# Patient Record
Sex: Male | Born: 1955 | Race: White | Hispanic: No | Marital: Married | Smoking: Current every day smoker
Health system: Southern US, Community
[De-identification: ages and names within clinical notes are randomized; demographics above are authoritative.]

## PROBLEM LIST (undated history)

## (undated) DIAGNOSIS — M545 Low back pain, unspecified: Secondary | ICD-10-CM

## (undated) DIAGNOSIS — F32A Depression, unspecified: Secondary | ICD-10-CM

## (undated) DIAGNOSIS — R7989 Other specified abnormal findings of blood chemistry: Secondary | ICD-10-CM

## (undated) DIAGNOSIS — N529 Male erectile dysfunction, unspecified: Secondary | ICD-10-CM

## (undated) DIAGNOSIS — Z8619 Personal history of other infectious and parasitic diseases: Secondary | ICD-10-CM

## (undated) DIAGNOSIS — G8929 Other chronic pain: Secondary | ICD-10-CM

## (undated) DIAGNOSIS — G4700 Insomnia, unspecified: Secondary | ICD-10-CM

## (undated) DIAGNOSIS — F329 Major depressive disorder, single episode, unspecified: Secondary | ICD-10-CM

## (undated) DIAGNOSIS — I10 Essential (primary) hypertension: Secondary | ICD-10-CM

## (undated) DIAGNOSIS — F419 Anxiety disorder, unspecified: Secondary | ICD-10-CM

## (undated) HISTORY — PX: HERNIA REPAIR: SHX51

## (undated) HISTORY — PX: FINGER SURGERY: SHX640

## (undated) HISTORY — DX: Porphyria cutanea tarda: E80.1

## (undated) HISTORY — DX: Other specified abnormal findings of blood chemistry: R79.89

## (undated) HISTORY — PX: MULTIPLE TOOTH EXTRACTIONS: SHX2053

---

## 2004-02-24 ENCOUNTER — Ambulatory Visit: Payer: Self-pay | Admitting: Internal Medicine

## 2004-06-25 ENCOUNTER — Ambulatory Visit: Payer: Self-pay | Admitting: Internal Medicine

## 2004-06-26 ENCOUNTER — Ambulatory Visit: Payer: Self-pay | Admitting: Internal Medicine

## 2004-08-06 ENCOUNTER — Ambulatory Visit: Payer: Self-pay | Admitting: Internal Medicine

## 2004-08-24 ENCOUNTER — Ambulatory Visit: Payer: Self-pay | Admitting: Internal Medicine

## 2004-10-01 ENCOUNTER — Ambulatory Visit: Payer: Self-pay | Admitting: Internal Medicine

## 2004-10-24 ENCOUNTER — Ambulatory Visit: Payer: Self-pay | Admitting: Internal Medicine

## 2004-11-27 ENCOUNTER — Ambulatory Visit: Payer: Self-pay | Admitting: Internal Medicine

## 2004-12-24 ENCOUNTER — Ambulatory Visit: Payer: Self-pay | Admitting: Internal Medicine

## 2005-01-24 ENCOUNTER — Ambulatory Visit: Payer: Self-pay | Admitting: Internal Medicine

## 2005-02-23 ENCOUNTER — Ambulatory Visit: Payer: Self-pay | Admitting: Internal Medicine

## 2005-03-28 ENCOUNTER — Ambulatory Visit: Payer: Self-pay | Admitting: Internal Medicine

## 2005-04-25 ENCOUNTER — Ambulatory Visit: Payer: Self-pay | Admitting: Internal Medicine

## 2005-05-26 ENCOUNTER — Ambulatory Visit: Payer: Self-pay | Admitting: Internal Medicine

## 2005-06-26 ENCOUNTER — Ambulatory Visit: Payer: Self-pay | Admitting: Internal Medicine

## 2005-08-22 ENCOUNTER — Ambulatory Visit: Payer: Self-pay | Admitting: Internal Medicine

## 2005-08-24 ENCOUNTER — Ambulatory Visit: Payer: Self-pay | Admitting: Internal Medicine

## 2005-08-27 ENCOUNTER — Emergency Department: Payer: Self-pay | Admitting: Emergency Medicine

## 2005-11-17 ENCOUNTER — Ambulatory Visit: Payer: Self-pay | Admitting: Internal Medicine

## 2005-11-23 ENCOUNTER — Ambulatory Visit: Payer: Self-pay | Admitting: Internal Medicine

## 2006-03-20 ENCOUNTER — Ambulatory Visit: Payer: Self-pay | Admitting: Internal Medicine

## 2006-03-26 ENCOUNTER — Ambulatory Visit: Payer: Self-pay | Admitting: Internal Medicine

## 2006-11-24 ENCOUNTER — Ambulatory Visit: Payer: Self-pay | Admitting: Internal Medicine

## 2006-12-25 ENCOUNTER — Ambulatory Visit: Payer: Self-pay | Admitting: Internal Medicine

## 2007-01-12 ENCOUNTER — Ambulatory Visit: Payer: Self-pay | Admitting: Internal Medicine

## 2007-01-25 ENCOUNTER — Ambulatory Visit: Payer: Self-pay | Admitting: Internal Medicine

## 2007-02-24 ENCOUNTER — Ambulatory Visit: Payer: Self-pay | Admitting: Internal Medicine

## 2011-09-30 ENCOUNTER — Ambulatory Visit: Payer: Self-pay | Admitting: Sports Medicine

## 2012-01-13 ENCOUNTER — Emergency Department: Payer: Self-pay | Admitting: *Deleted

## 2014-05-19 ENCOUNTER — Emergency Department: Payer: Self-pay | Admitting: Internal Medicine

## 2014-05-19 LAB — TROPONIN I: Troponin-I: <0.02 ng/mL

## 2014-05-19 LAB — COMPREHENSIVE METABOLIC PANEL
AST: 30 U/L (ref 15–37)
Albumin: 3.9 g/dL (ref 3.4–5.0)
Alkaline Phosphatase: 61 U/L
Anion Gap: 9 (ref 7–16)
BUN: 6 mg/dL — AB (ref 7–18)
Bilirubin,Total: 0.8 mg/dL (ref 0.2–1.0)
CHLORIDE: 105 mmol/L (ref 98–107)
Calcium, Total: 8.7 mg/dL (ref 8.5–10.1)
Co2: 22 mmol/L (ref 21–32)
Creatinine: 0.77 mg/dL (ref 0.60–1.30)
EGFR (African American): >60
Glucose: 104 mg/dL — ABNORMAL HIGH (ref 65–99)
Osmolality: 270 (ref 275–301)
Potassium: 4.1 mmol/L (ref 3.5–5.1)
SGPT (ALT): 24 U/L
Sodium: 136 mmol/L (ref 136–145)
Total Protein: 7.6 g/dL (ref 6.4–8.2)

## 2014-05-19 LAB — CBC
HCT: 45.1 % (ref 40.0–52.0)
HGB: 15 g/dL (ref 13.0–18.0)
MCH: 36.3 pg — ABNORMAL HIGH (ref 26.0–34.0)
MCHC: 33.3 g/dL (ref 32.0–36.0)
MCV: 109 fL — ABNORMAL HIGH (ref 80–100)
Platelet: 143 10*3/uL — ABNORMAL LOW (ref 150–440)
RBC: 4.15 10*6/uL — AB (ref 4.40–5.90)
RDW: 12.6 % (ref 11.5–14.5)
WBC: 3.7 10*3/uL — ABNORMAL LOW (ref 3.8–10.6)

## 2015-11-26 IMAGING — CT CT CERVICAL SPINE WITHOUT CONTRAST
3 of 5 series · 10 of 33 positions shown, 12 images · non-contrast
Comparison: None.

CLINICAL DATA: Left arm numbness and shoulder pain for 4 weeks

EXAM:
CT HEAD WITHOUT CONTRAST
CT CERVICAL SPINE WITHOUT CONTRAST
TECHNIQUE: Multidetector CT imaging of the head and cervical spine was
performed following the standard protocol without intravenous
contrast. Multiplanar CT image reconstructions of the cervical spine
were also generated.

[Series 8: sag bone · sagittal · 0.21mm/px · 5 of 62 slices shown, 6 images]
[im 21/62  bone]
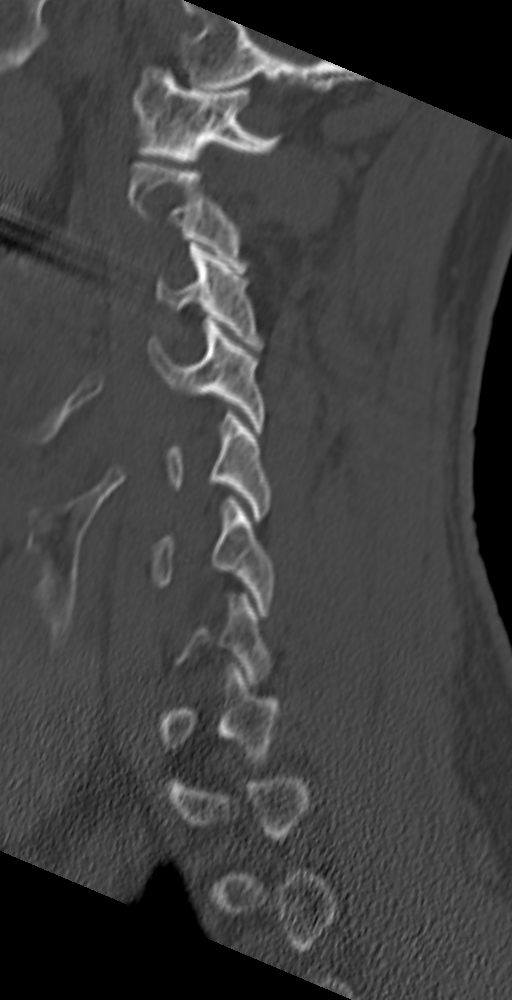
[im 26/62  bone]
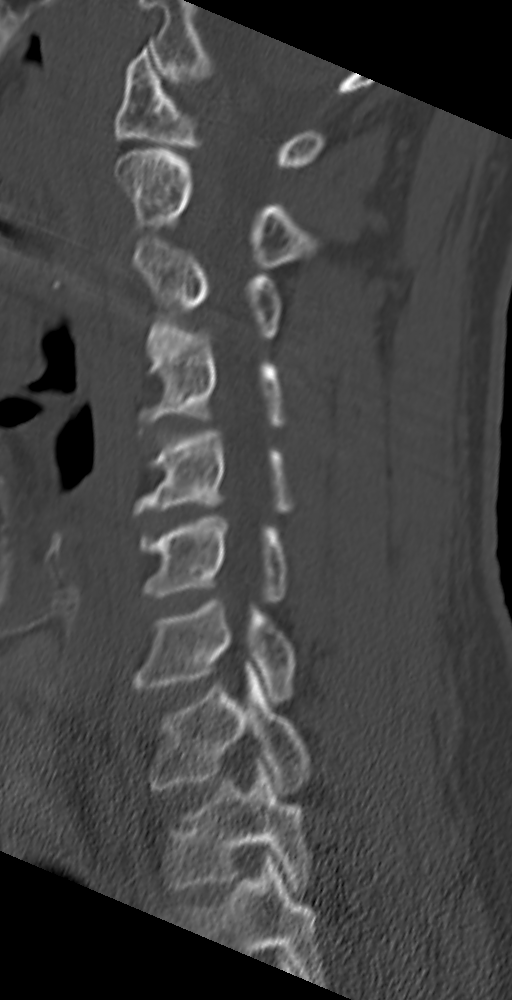
[im 31/62  soft-tissue]
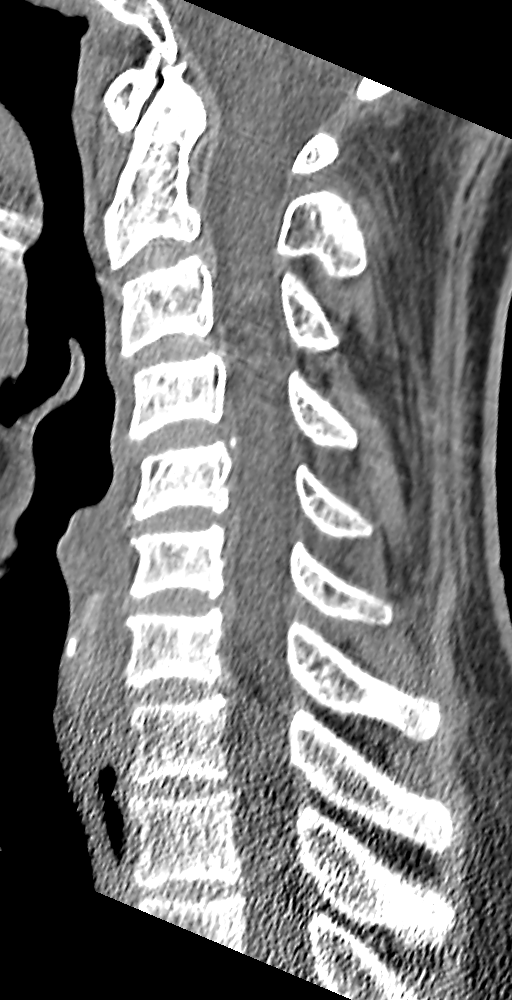
[im 31/62  bone]
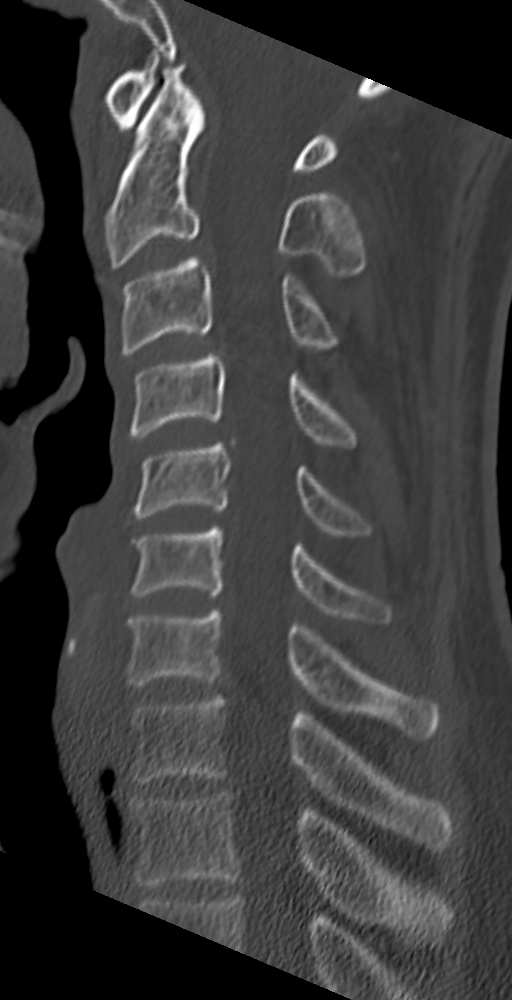
[im 36/62  bone]
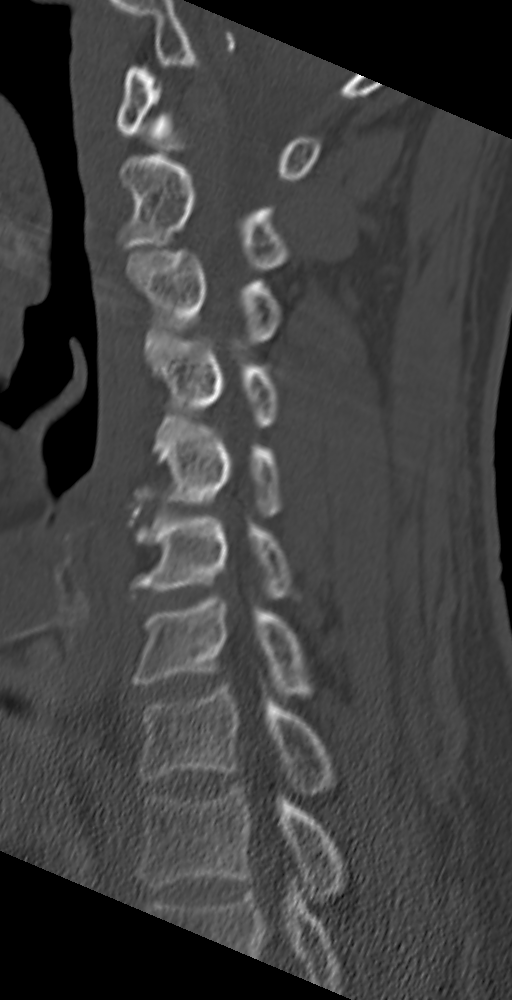
[im 41/62  bone]
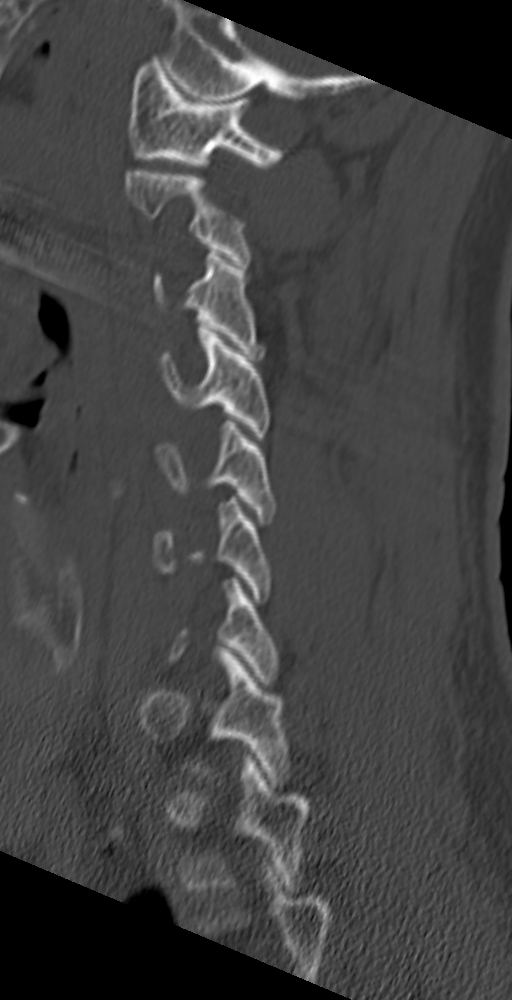

[Series 9: cor bone · coronal · 0.23mm/px · 3 of 48 slices shown]
[im 10/48  bone]
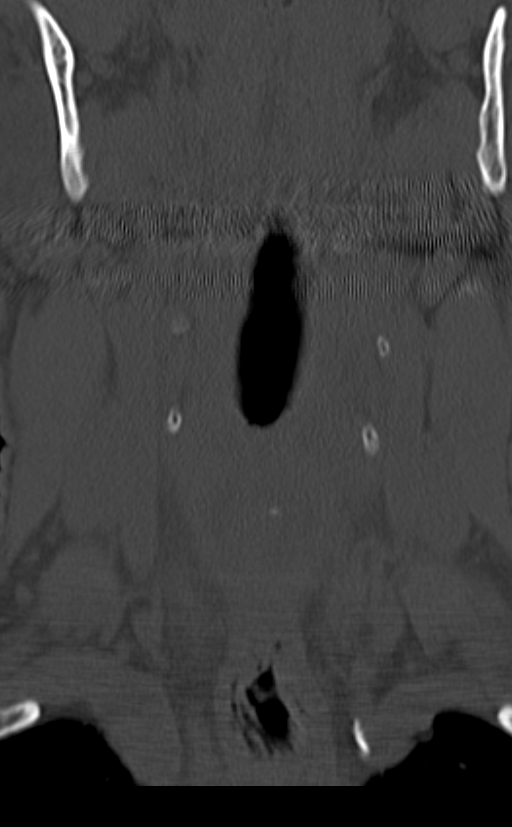
[im 19/48  bone]
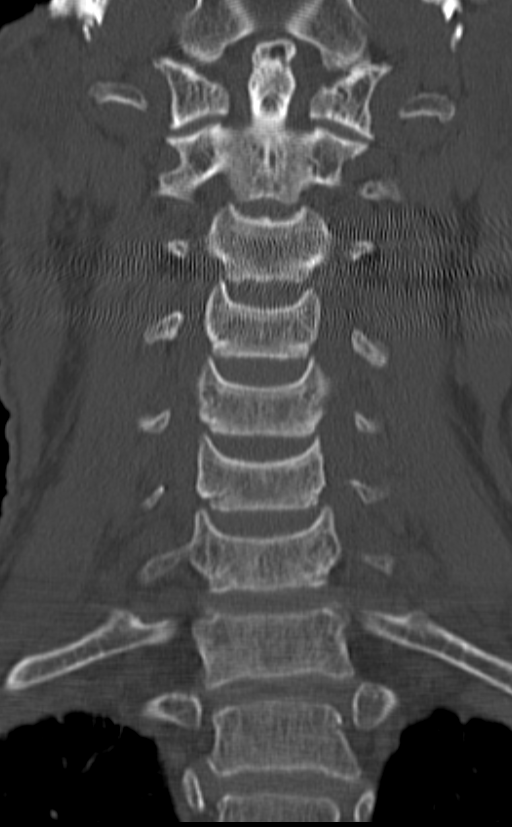
[im 29/48  bone]
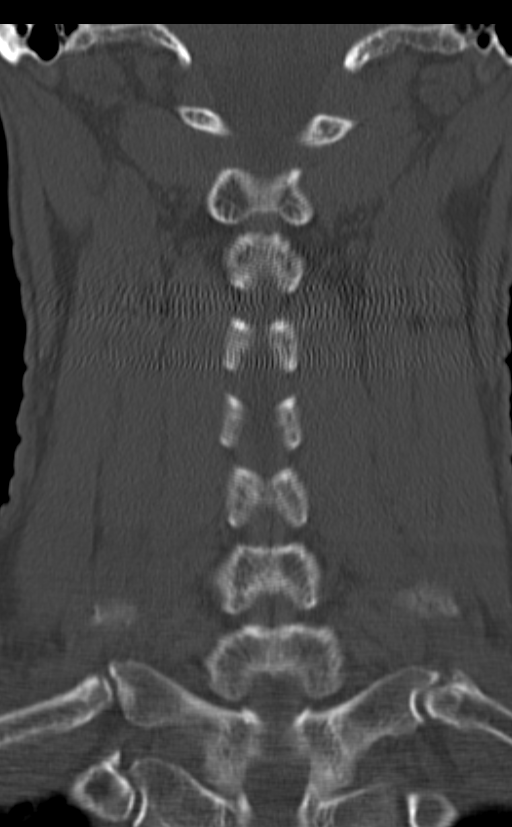

[Series 10: orthogonal axials · axial · 0.19mm/px · z∈[-203,-134]mm · 2 of 95 slices shown, 3 images]
[im 38/95  soft-tissue]
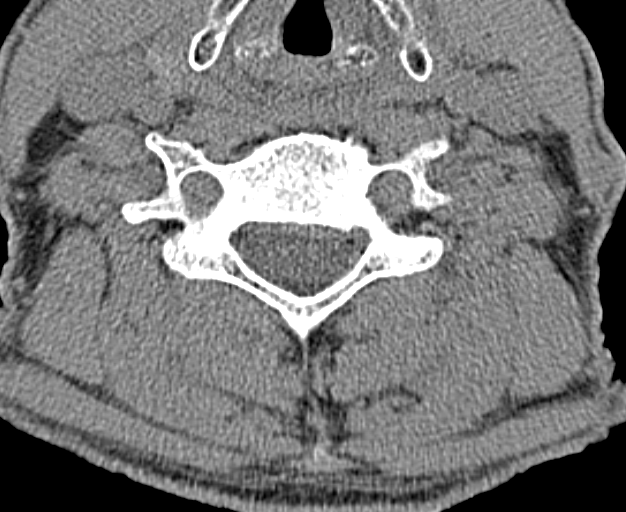
[im 38/95  bone]
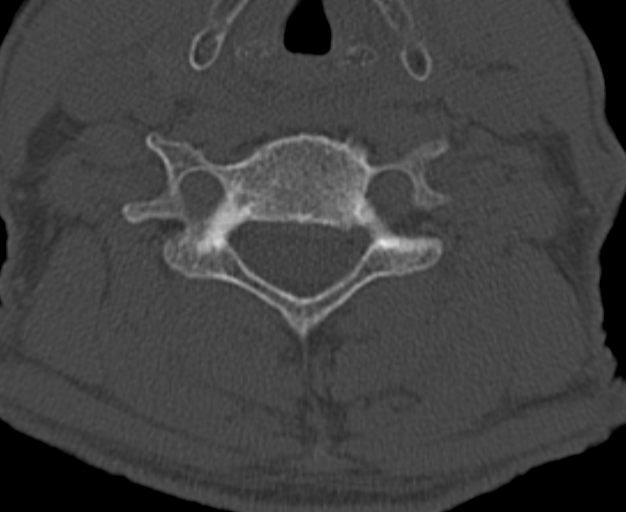
[im 76/95  bone]
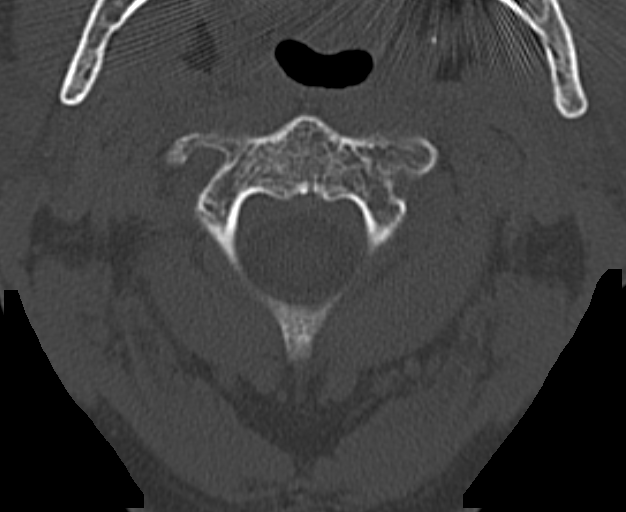

[10 of 33 positions shown; findings below may reference images not displayed]

FINDINGS: CT HEAD FINDINGS

Bony calvarium is intact. Atrophic changes are identified. No
findings to suggest acute hemorrhage, acute infarction or
space-occupying mass lesion are noted.

CT CERVICAL SPINE FINDINGS

Seven cervical segments are well visualized. Vertebral body height
is well maintained. Mild facet hypertrophic changes. No acute
fracture or acute facet abnormality is noted. The surrounding soft
tissues are within normal limits. Lung apices demonstrate mild
emphysematous changes.
IMPRESSION: CT of the head:  Mild atrophic changes without acute abnormality.

CT of the cervical spine: Degenerative change without acute
abnormality.

CT of the cervical spine: Mild degenerative changes without acute
abnormality.

## 2016-03-26 DIAGNOSIS — G8929 Other chronic pain: Secondary | ICD-10-CM | POA: Insufficient documentation

## 2016-08-08 ENCOUNTER — Encounter: Payer: Self-pay | Admitting: *Deleted

## 2016-10-06 ENCOUNTER — Other Ambulatory Visit: Payer: Self-pay | Admitting: Physical Medicine and Rehabilitation

## 2016-10-06 DIAGNOSIS — M5414 Radiculopathy, thoracic region: Secondary | ICD-10-CM

## 2016-10-15 ENCOUNTER — Ambulatory Visit: Payer: Medicare Other

## 2016-10-30 ENCOUNTER — Encounter: Payer: Self-pay | Admitting: *Deleted

## 2016-10-31 ENCOUNTER — Ambulatory Visit
Admission: RE | Admit: 2016-10-31 | Discharge: 2016-10-31 | Disposition: A | Discharge status: Home / Self Care | Payer: Medicare Other | Source: Ambulatory Visit | Attending: Unknown Physician Specialty | Admitting: Unknown Physician Specialty

## 2016-10-31 ENCOUNTER — Ambulatory Visit: Payer: Medicare Other | Admitting: Certified Registered"

## 2016-10-31 ENCOUNTER — Encounter: Payer: Self-pay | Admitting: *Deleted

## 2016-10-31 ENCOUNTER — Encounter
Admission: RE | Disposition: A | Discharge status: Home / Self Care | Payer: Self-pay | Source: Ambulatory Visit | Attending: Unknown Physician Specialty

## 2016-10-31 DIAGNOSIS — M545 Low back pain: Secondary | ICD-10-CM | POA: Insufficient documentation

## 2016-10-31 DIAGNOSIS — F329 Major depressive disorder, single episode, unspecified: Secondary | ICD-10-CM | POA: Diagnosis not present

## 2016-10-31 DIAGNOSIS — F419 Anxiety disorder, unspecified: Secondary | ICD-10-CM | POA: Diagnosis not present

## 2016-10-31 DIAGNOSIS — Z79899 Other long term (current) drug therapy: Secondary | ICD-10-CM | POA: Insufficient documentation

## 2016-10-31 DIAGNOSIS — K5730 Diverticulosis of large intestine without perforation or abscess without bleeding: Secondary | ICD-10-CM | POA: Diagnosis not present

## 2016-10-31 DIAGNOSIS — I10 Essential (primary) hypertension: Secondary | ICD-10-CM | POA: Diagnosis not present

## 2016-10-31 DIAGNOSIS — D124 Benign neoplasm of descending colon: Secondary | ICD-10-CM | POA: Diagnosis not present

## 2016-10-31 DIAGNOSIS — N529 Male erectile dysfunction, unspecified: Secondary | ICD-10-CM | POA: Diagnosis not present

## 2016-10-31 DIAGNOSIS — G4700 Insomnia, unspecified: Secondary | ICD-10-CM | POA: Diagnosis not present

## 2016-10-31 DIAGNOSIS — K6389 Other specified diseases of intestine: Secondary | ICD-10-CM | POA: Diagnosis not present

## 2016-10-31 DIAGNOSIS — Z1211 Encounter for screening for malignant neoplasm of colon: Secondary | ICD-10-CM | POA: Diagnosis not present

## 2016-10-31 DIAGNOSIS — F17210 Nicotine dependence, cigarettes, uncomplicated: Secondary | ICD-10-CM | POA: Diagnosis not present

## 2016-10-31 DIAGNOSIS — K640 First degree hemorrhoids: Secondary | ICD-10-CM | POA: Insufficient documentation

## 2016-10-31 DIAGNOSIS — G8929 Other chronic pain: Secondary | ICD-10-CM | POA: Diagnosis not present

## 2016-10-31 HISTORY — DX: Depression, unspecified: F32.A

## 2016-10-31 HISTORY — PX: COLONOSCOPY WITH PROPOFOL: SHX5780

## 2016-10-31 HISTORY — DX: Low back pain: M54.5

## 2016-10-31 HISTORY — DX: Personal history of other infectious and parasitic diseases: Z86.19

## 2016-10-31 HISTORY — DX: Anxiety disorder, unspecified: F41.9

## 2016-10-31 HISTORY — DX: Male erectile dysfunction, unspecified: N52.9

## 2016-10-31 HISTORY — DX: Other chronic pain: G89.29

## 2016-10-31 HISTORY — DX: Essential (primary) hypertension: I10

## 2016-10-31 HISTORY — DX: Major depressive disorder, single episode, unspecified: F32.9

## 2016-10-31 HISTORY — DX: Insomnia, unspecified: G47.00

## 2016-10-31 HISTORY — DX: Low back pain, unspecified: M54.50

## 2016-10-31 SURGERY — COLONOSCOPY WITH PROPOFOL
Anesthesia: General

## 2016-10-31 MED ORDER — MIDAZOLAM HCL 2 MG/2ML IJ SOLN
INTRAMUSCULAR | Status: AC
  Filled 2016-10-31: qty 2

## 2016-10-31 MED ORDER — SODIUM CHLORIDE 0.9 % IV SOLN
INTRAVENOUS | Status: DC
  Administered 2016-10-31: 1000 mL via INTRAVENOUS

## 2016-10-31 MED ORDER — MIDAZOLAM HCL 5 MG/5ML IJ SOLN
INTRAMUSCULAR | Status: DC | PRN
  Administered 2016-10-31: 2 mg via INTRAVENOUS

## 2016-10-31 MED ORDER — SODIUM CHLORIDE 0.9 % IV SOLN: INTRAVENOUS | Status: DC

## 2016-10-31 MED ORDER — PROPOFOL 500 MG/50ML IV EMUL
INTRAVENOUS | Status: DC | PRN
  Administered 2016-10-31: 120 ug/kg/min via INTRAVENOUS

## 2016-10-31 MED ORDER — LIDOCAINE 2% (20 MG/ML) 5 ML SYRINGE
INTRAMUSCULAR | Status: DC | PRN
  Administered 2016-10-31: 25 mg via INTRAVENOUS

## 2016-10-31 MED ORDER — PROPOFOL 10 MG/ML IV BOLUS
INTRAVENOUS | Status: DC | PRN
  Administered 2016-10-31: 70 mg via INTRAVENOUS
  Administered 2016-10-31: 30 mg via INTRAVENOUS

## 2016-10-31 NOTE — Anesthesia Preprocedure Evaluation (Addendum)
Anesthesia Evaluation  Patient identified by MRN, date of birth, ID band Patient awake    Reviewed: Allergy & Precautions, NPO status , Patient's Chart, lab work & pertinent test results  Airway Mallampati: III       Dental  (+) Partial Upper, Partial Lower   Pulmonary COPD, Current Smoker,     + decreased breath sounds      Cardiovascular Exercise Tolerance: Good hypertension, Pt. on medications and Pt. on home beta blockers  Rhythm:Regular     Neuro/Psych Anxiety Depression    GI/Hepatic negative GI ROS, Neg liver ROS,   Endo/Other  negative endocrine ROS  Renal/GU negative Renal ROS     Musculoskeletal negative musculoskeletal ROS (+)   Abdominal Normal abdominal exam  (+)   Peds  Hematology negative hematology ROS (+)   Anesthesia Other Findings   Reproductive/Obstetrics                            Anesthesia Physical Anesthesia Plan  ASA: III  Anesthesia Plan: General   Post-op Pain Management:    Induction: Intravenous  PONV Risk Score and Plan: 0  Airway Management Planned: Natural Airway  Additional Equipment:   Intra-op Plan:   Post-operative Plan:   Informed Consent: I have reviewed the patients History and Physical, chart, labs and discussed the procedure including the risks, benefits and alternatives for the proposed anesthesia with the patient or authorized representative who has indicated his/her understanding and acceptance.     Plan Discussed with: CRNA  Anesthesia Plan Comments:         Anesthesia Quick Evaluation

## 2016-10-31 NOTE — Anesthesia Postprocedure Evaluation (Signed)
Anesthesia Post Note  Patient: Shawn Gonzalez  Procedure(s) Performed: Procedure(s) (LRB): COLONOSCOPY WITH PROPOFOL (N/A)  Patient location during evaluation: PACU Anesthesia Type: General Level of consciousness: awake Pain management: pain level controlled Vital Signs Assessment: post-procedure vital signs reviewed and stable Respiratory status: spontaneous breathing Cardiovascular status: stable Anesthetic complications: no     Last Vitals:  Vitals:   10/31/16 0949 10/31/16 0959  BP: (!) 146/92 (!) 155/88  Pulse: (!) 56 (!) 52  Resp: 17 (!) 21  Temp:      Last Pain:  Vitals:   10/31/16 0929  TempSrc: Tympanic                 VAN STAVEREN,Markise Haymer

## 2016-10-31 NOTE — H&P (Signed)
   Primary Care Physician:  Idelle Crouch, MD Primary Gastroenterologist:  Dr. Vira Agar  Pre-Procedure History & Physical: HPI:  Shawn Gonzalez is a 61 y.o. male is here for an colonoscopy.   Past Medical History:  Diagnosis Date  . Anxiety   . Chronic low back pain   . Depression   . Erectile dysfunction   . History of chicken pox   . Hypertension   . Insomnia     Past Surgical History:  Procedure Laterality Date  . FINGER SURGERY Right    RT.INDEX  . MULTIPLE TOOTH EXTRACTIONS      Prior to Admission medications   Medication Sig Start Date End Date Taking? Authorizing Provider  amLODipine (NORVASC) 10 MG tablet Take 10 mg by mouth daily.   Yes [provider]  bisoprolol-hydrochlorothiazide (ZIAC) 5-6.25 MG tablet Take 1 tablet by mouth daily.   Yes [provider]  cyclobenzaprine (FLEXERIL) 10 MG tablet Take 10 mg by mouth 3 (three) times daily as needed for muscle spasms.   Yes [provider]  HYDROcodone-acetaminophen (NORCO/VICODIN) 5-325 MG tablet Take 1 tablet by mouth every 6 (six) hours as needed for moderate pain.   Yes [provider]  lisinopril (PRINIVIL,ZESTRIL) 40 MG tablet Take 40 mg by mouth daily.   Yes [provider]  polyethylene glycol-electrolytes (NULYTELY/GOLYTELY) 420 g solution Take 4,000 mLs by mouth once.   Yes [provider]  sildenafil (VIAGRA) 50 MG tablet Take 50 mg by mouth daily as needed for erectile dysfunction.   Yes [provider]    Allergies as of 06/04/2016  . (Not on File)    History reviewed. No pertinent family history.  Social History   Social History  . Marital status: Married    Spouse name: N/A  . Number of children: N/A  . Years of education: N/A   Occupational History  . Not on file.   Social History Main Topics  . Smoking status: Current Every Day Smoker    Packs/day: 1.00    Years: 42.00    Types: Cigarettes  . Smokeless tobacco:  Never Used  . Alcohol use Yes  . Drug use: No  . Sexual activity: Not on file   Other Topics Concern  . Not on file   Social History Narrative  . No narrative on file    Review of Systems: See HPI, otherwise negative ROS  Physical Exam: BP (!) 150/87   Pulse 65   Temp 98.2 F (36.8 C) (Tympanic)   Resp 18   Ht 5\' 8"  (1.727 m)   Wt 79.8 kg (176 lb)   SpO2 100%   BMI 26.76 kg/m  General:   Alert,  pleasant and cooperative in NAD Head:  Normocephalic and atraumatic. Neck:  Supple; no masses or thyromegaly. Lungs:  Clear throughout to auscultation.    Heart:  Regular rate and rhythm. Abdomen:  Soft, nontender and nondistended. Normal bowel sounds, without guarding, and without rebound.   Neurologic:  Alert and  oriented x4;  grossly normal neurologically.  Impression/Plan: Shawn Gonzalez is here for an colonoscopy to be performed for screening colonoscopy  Risks, benefits, limitations, and alternatives regarding  colonoscopy have been reviewed with the patient.  Questions have been answered.  All parties agreeable.   Gaylyn Cheers, MD  10/31/2016, 8:31 AM

## 2016-10-31 NOTE — Transfer of Care (Signed)
Immediate Anesthesia Transfer of Care Note  Patient: Shawn Gonzalez  Procedure(s) Performed: Procedure(s): COLONOSCOPY WITH PROPOFOL (N/A)  Patient Location: Endoscopy Unit  Anesthesia Type:General  Level of Consciousness: awake  Airway & Oxygen Therapy: Patient Spontanous Breathing and Patient connected to nasal cannula oxygen  Post-op Assessment: Report given to RN and Post -op Vital signs reviewed and stable  Post vital signs: Reviewed  Last Vitals:  Vitals:   10/31/16 0821 10/31/16 0929  BP: (!) 150/87 121/79  Pulse: 65   Resp: 18 18  Temp: 36.8 C 36.6 C    Last Pain:  Vitals:   10/31/16 0929  TempSrc: Tympanic         Complications: No apparent anesthesia complications

## 2016-10-31 NOTE — Op Note (Signed)
[facility] Gastroenterology Patient Name: Shawn Gonzalez Procedure Date: 10/31/2016 8:41 AM MRN: [id number] Account #: [id number] Date of Birth: 30-Mar-1956 Admit Type: Outpatient Age: 61 Room: [facility] ENDO ROOM 1 Gender: Male Note Status: Finalized Procedure:            Colonoscopy Indications:          Screening for colorectal malignant neoplasm Providers:            Manya Silvas, MD Referring MD:         Leonie Douglas. Doy Hutching, MD (Referring MD) Medicines:            Propofol per Anesthesia Complications:        No immediate complications. Procedure:            Pre-Anesthesia Assessment:                       - After reviewing the risks and benefits, the patient                        was deemed in satisfactory condition to undergo the                        procedure.                       After obtaining informed consent, the colonoscope was                        passed under direct vision. Throughout the procedure,                        the patient's blood pressure, pulse, and oxygen                        saturations were monitored continuously. The                        Colonoscope was introduced through the anus and                        advanced to the the cecum, identified by appendiceal                        orifice and ileocecal valve. The colonoscopy was                        performed without difficulty. The patient tolerated the                        procedure well. The quality of the bowel preparation                        was good. Findings:      A small polyp was found in the descending colon. The polyp was sessile.       The polyp was removed with a hot snare. Resection and retrieval were       complete.      A few small-mouthed diverticula were found in the sigmoid colon.      A localized area of mildly erythematous mucosa was found in the sigmoid  colon. Biopsies were taken with a cold forceps for histology.      Internal  hemorrhoids were found during endoscopy. The hemorrhoids were       small and Grade I (internal hemorrhoids that do not prolapse). Impression:           - One small polyp in the descending colon, removed with                        a hot snare. Resected and retrieved.                       - Diverticulosis in the sigmoid colon.                       - Erythematous mucosa in the sigmoid colon. Biopsied.                       - Internal hemorrhoids. Recommendation:       - Await pathology results. Manya Silvas, MD 10/31/2016 9:27:18 AM This report has been signed electronically. Number of Addenda: 0 Note Initiated On: 10/31/2016 8:41 AM Scope Withdrawal Time: 0 hours 24 minutes 21 seconds  Total Procedure Duration: 0 hours 32 minutes 43 seconds       [facility]

## 2016-10-31 NOTE — Anesthesia Post-op Follow-up Note (Cosign Needed)
Anesthesia QCDR form completed.        

## 2016-11-03 ENCOUNTER — Encounter: Payer: Self-pay | Admitting: Unknown Physician Specialty

## 2016-11-03 LAB — SURGICAL PATHOLOGY

## 2017-01-05 ENCOUNTER — Ambulatory Visit: Payer: Medicare Other

## 2017-01-05 ENCOUNTER — Ambulatory Visit
Admission: RE | Admit: 2017-01-05 | Discharge: 2017-01-05 | Disposition: A | Discharge status: Home / Self Care | Payer: Medicare Other | Source: Ambulatory Visit | Attending: Physical Medicine and Rehabilitation | Admitting: Physical Medicine and Rehabilitation

## 2017-01-05 DIAGNOSIS — M5134 Other intervertebral disc degeneration, thoracic region: Secondary | ICD-10-CM | POA: Insufficient documentation

## 2017-01-05 DIAGNOSIS — M438X5 Other specified deforming dorsopathies, thoracolumbar region: Secondary | ICD-10-CM | POA: Insufficient documentation

## 2017-01-05 DIAGNOSIS — M5414 Radiculopathy, thoracic region: Secondary | ICD-10-CM | POA: Diagnosis not present

## 2018-12-10 ENCOUNTER — Other Ambulatory Visit: Payer: Self-pay

## 2018-12-10 ENCOUNTER — Inpatient Hospital Stay: Payer: Medicare Other | Attending: Oncology | Admitting: Oncology

## 2018-12-10 ENCOUNTER — Telehealth: Payer: Self-pay | Admitting: *Deleted

## 2018-12-10 ENCOUNTER — Inpatient Hospital Stay: Payer: Medicare Other

## 2018-12-10 ENCOUNTER — Encounter (INDEPENDENT_AMBULATORY_CARE_PROVIDER_SITE_OTHER): Payer: Self-pay

## 2018-12-10 ENCOUNTER — Other Ambulatory Visit: Payer: Self-pay | Admitting: *Deleted

## 2018-12-10 VITALS — BP 137/83 | HR 74 | Resp 18

## 2018-12-10 VITALS — BP 118/76 | HR 65 | Temp 99.4°F | Resp 18 | Wt 164.3 lb

## 2018-12-10 DIAGNOSIS — R7989 Other specified abnormal findings of blood chemistry: Secondary | ICD-10-CM | POA: Diagnosis present

## 2018-12-10 DIAGNOSIS — G4700 Insomnia, unspecified: Secondary | ICD-10-CM | POA: Insufficient documentation

## 2018-12-10 DIAGNOSIS — M545 Low back pain: Secondary | ICD-10-CM | POA: Insufficient documentation

## 2018-12-10 DIAGNOSIS — G8929 Other chronic pain: Secondary | ICD-10-CM | POA: Diagnosis not present

## 2018-12-10 DIAGNOSIS — F419 Anxiety disorder, unspecified: Secondary | ICD-10-CM | POA: Insufficient documentation

## 2018-12-10 DIAGNOSIS — F17210 Nicotine dependence, cigarettes, uncomplicated: Secondary | ICD-10-CM | POA: Insufficient documentation

## 2018-12-10 DIAGNOSIS — I10 Essential (primary) hypertension: Secondary | ICD-10-CM | POA: Diagnosis not present

## 2018-12-10 DIAGNOSIS — E871 Hypo-osmolality and hyponatremia: Secondary | ICD-10-CM

## 2018-12-10 DIAGNOSIS — F329 Major depressive disorder, single episode, unspecified: Secondary | ICD-10-CM | POA: Insufficient documentation

## 2018-12-10 LAB — CBC WITH DIFFERENTIAL/PLATELET
Abs Immature Granulocytes: 0.01 10*3/uL (ref 0.00–0.07)
Basophils Absolute: 0 10*3/uL (ref 0.0–0.1)
Basophils Relative: 1 %
Eosinophils Absolute: 0.1 10*3/uL (ref 0.0–0.5)
Eosinophils Relative: 3 %
HCT: 41.5 % (ref 39.0–52.0)
Hemoglobin: 14.5 g/dL (ref 13.0–17.0)
Immature Granulocytes: 0 %
Lymphocytes Relative: 22 %
Lymphs Abs: 0.9 10*3/uL (ref 0.7–4.0)
MCH: 36.5 pg — ABNORMAL HIGH (ref 26.0–34.0)
MCHC: 34.9 g/dL (ref 30.0–36.0)
MCV: 104.5 fL — ABNORMAL HIGH (ref 80.0–100.0)
Monocytes Absolute: 0.5 10*3/uL (ref 0.1–1.0)
Monocytes Relative: 13 %
Neutro Abs: 2.5 10*3/uL (ref 1.7–7.7)
Neutrophils Relative %: 61 %
Platelets: 132 10*3/uL — ABNORMAL LOW (ref 150–400)
RBC: 3.97 MIL/uL — ABNORMAL LOW (ref 4.22–5.81)
RDW: 12.3 % (ref 11.5–15.5)
WBC: 4.1 10*3/uL (ref 4.0–10.5)
nRBC: 0 % (ref 0.0–0.2)

## 2018-12-10 LAB — IRON AND TIBC
Iron: 138 ug/dL (ref 45–182)
Saturation Ratios: 38 % (ref 17.9–39.5)
TIBC: 360 ug/dL (ref 250–450)
UIBC: 222 ug/dL

## 2018-12-10 LAB — COMPREHENSIVE METABOLIC PANEL
ALT: 49 U/L — ABNORMAL HIGH (ref 0–44)
AST: 40 U/L (ref 15–41)
Albumin: 4.6 g/dL (ref 3.5–5.0)
Alkaline Phosphatase: 56 U/L (ref 38–126)
Anion gap: 10 (ref 5–15)
BUN: 16 mg/dL (ref 8–23)
CO2: 21 mmol/L — ABNORMAL LOW (ref 22–32)
Calcium: 9.7 mg/dL (ref 8.9–10.3)
Chloride: 98 mmol/L (ref 98–111)
Creatinine, Ser: 0.95 mg/dL (ref 0.61–1.24)
GFR calc Af Amer: >60 mL/min (ref >60)
GFR calc non Af Amer: >60 mL/min (ref >60)
Glucose, Bld: 97 mg/dL (ref 70–99)
Potassium: 4.1 mmol/L (ref 3.5–5.1)
Sodium: 129 mmol/L — ABNORMAL LOW (ref 135–145)
Total Bilirubin: 1.6 mg/dL — ABNORMAL HIGH (ref 0.3–1.2)
Total Protein: 8.1 g/dL (ref 6.5–8.1)

## 2018-12-10 LAB — FERRITIN: Ferritin: 772 ng/mL — ABNORMAL HIGH (ref 24–336)

## 2018-12-10 NOTE — Telephone Encounter (Signed)
Called patient's home and left a message on his voicemail stating that he does not need to come back and bring [place] any more urine we had a nurse to do all the tests that we needed to send off for.  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 Janese Banks wants to make sure that he is drinking plenty of liquids if he can keep up with at least drinking 8 glasses of water a day and then other liquids if needed.  We will repeat his level when he comes back next Friday and see what it looks like then.  If he has any questions he can call me back and I left my direct number

## 2018-12-10 NOTE — Progress Notes (Signed)
Patient is here today for follow up, he is doing well no complaints.  

## 2018-12-11 LAB — HIV ANTIBODY (ROUTINE TESTING W REFLEX): HIV Screen 4th Generation wRfx: NONREACTIVE

## 2018-12-11 LAB — HEPATITIS B SURFACE ANTIGEN: Hepatitis B Surface Ag: NEGATIVE

## 2018-12-11 LAB — HEPATITIS B SURFACE ANTIBODY, QUANTITATIVE: Hepatitis B-Post: <3.1 m[IU]/mL — ABNORMAL LOW (ref >9.9)

## 2018-12-11 LAB — HEPATITIS C ANTIBODY: HCV Ab: <0.1 s/co ratio (ref 0.0–0.9)

## 2018-12-13 LAB — MISC LABCORP TEST (SEND OUT): LabCorp test name: 3053

## 2018-12-13 LAB — PORPHOBILINOGEN, RANDOM URINE: Quantitative Porphobilinogen: 0.6 mg/L (ref 0.0–2.0)

## 2018-12-14 LAB — PORPHYRINS, FRACTIONATED, RANDOM URINE
Coproporphyrin (CP) I: 64 ug/L — ABNORMAL HIGH (ref 0–15)
Coproporphyrin (CP) III: 58 ug/L — ABNORMAL HIGH (ref 0–49)
Heptacarboxyl (7-CP): 395 ug/L — ABNORMAL HIGH (ref 0–2)
Hexacarboxyl (6-CP): <1 ug/L (ref 0–1)
Pentacarboxyl (5-CP): 38 ug/L — ABNORMAL HIGH (ref 0–2)
Uroporphyrins (UP): 380 ug/L — ABNORMAL HIGH (ref 0–20)

## 2018-12-14 LAB — MISC LABCORP TEST (SEND OUT): LabCorp test name: 120980

## 2018-12-15 ENCOUNTER — Encounter: Payer: Self-pay | Admitting: Oncology

## 2018-12-15 NOTE — Progress Notes (Signed)
Hematology/Oncology Consult note [facility] Telephone:([phone] [phone]  Patient Care Team: Idelle Crouch, MD as PCP - General (Internal Medicine)   Name of the patient: Shawn Gonzalez  [id number]  1955-06-27    Reason for referral- elevated ferritin possible hemochromatosis   Referring physician- Dr. Doy Hutching  Date of visit: 12/15/18   History of presenting illness- Patient is a 63 year old male with a past medical history significant for hypertension, anxiety, chronic back pain and tobacco dependence.  He has had on and off blistering of his bilateral hands and has seen dermatology in the past and was attributed to hematological disorder.  Patient states that he has came to the cancer center many years ago and underwent phlebotomy at that time.  He is unsure if he has hemochromatosis.  Family history of any liver disease.  He does report drinking alcohol sometimes he may have a few beers and 1 go sometimes he may go for days without drink.  He also smokes 1 pack of cigarettes per day for the last 42 years.  Patient has not had phlebotomy for many years now  ECOG PS- 1  Pain scale- 0   Review of systems- Review of Systems  Constitutional: Negative for chills, fever, malaise/fatigue and weight loss.  HENT: Negative for congestion, ear discharge and nosebleeds.   Eyes: Negative for blurred vision.  Respiratory: Negative for cough, hemoptysis, sputum production, shortness of breath and wheezing.   Cardiovascular: Negative for chest pain, palpitations, orthopnea and claudication.  Gastrointestinal: Negative for abdominal pain, blood in stool, constipation, diarrhea, heartburn, melena, nausea and vomiting.  Genitourinary: Negative for dysuria, flank pain, frequency, hematuria and urgency.  Musculoskeletal: Negative for back pain, joint pain and myalgias.  Skin: Positive for rash (Sitting rash in his bilateral hands).  Neurological: Negative  for dizziness, tingling, focal weakness, seizures, weakness and headaches.  Endo/Heme/Allergies: Does not bruise/bleed easily.  Psychiatric/Behavioral: Negative for depression and suicidal ideas. The patient does not have insomnia.     Allergies  Allergen Reactions  . Celebrex [Celecoxib] Nausea Only    Patient Active Problem List   Diagnosis Date Noted  . Elevated ferritin level 12/10/2018     Past Medical History:  Diagnosis Date  . Anxiety   . Chronic low back pain   . Depression   . Erectile dysfunction   . History of chicken pox   . Hypertension   . Insomnia      Past Surgical History:  Procedure Laterality Date  . COLONOSCOPY WITH PROPOFOL N/A 10/31/2016   Procedure: COLONOSCOPY WITH PROPOFOL;  Surgeon: Manya Silvas, MD;  Location: [facility] ENDOSCOPY;  Service: Endoscopy;  Laterality: N/A;  . FINGER SURGERY Right    RT.INDEX  . MULTIPLE TOOTH EXTRACTIONS      Social History   Socioeconomic History  . Marital status: Married    Spouse name: Not on file  . Number of children: Not on file  . Years of education: Not on file  . Highest education level: Not on file  Occupational History  . Not on file  Social Needs  . Financial resource strain: Not on file  . Food insecurity    Worry: Not on file    Inability: Not on file  . Transportation needs    Medical: Not on file    Non-medical: Not on file  Tobacco Use  . Smoking status: Current Every Day Smoker    Packs/day: 1.00    Years: 42.00    Pack  years: 42.00    Types: Cigarettes  . Smokeless tobacco: Never Used  Substance and Sexual Activity  . Alcohol use: Yes  . Drug use: No  . Sexual activity: Not on file  Lifestyle  . Physical activity    Days per week: Not on file    Minutes per session: Not on file  . Stress: Not on file  Relationships  . Social Herbalist on phone: Not on file    Gets together: Not on file    Attends religious service: Not on file    Active member of club or  organization: Not on file    Attends meetings of clubs or organizations: Not on file    Relationship status: Not on file  . Intimate partner violence    Fear of current or ex partner: Not on file    Emotionally abused: Not on file    Physically abused: Not on file    Forced sexual activity: Not on file  Other Topics Concern  . Not on file  Social History Narrative  . Not on file     History reviewed. No pertinent family history.   Current Outpatient Medications:  .  amLODipine (NORVASC) 10 MG tablet, Take 10 mg by mouth daily., Disp: , Rfl:  .  bisoprolol-hydrochlorothiazide (ZIAC) 5-6.25 MG tablet, Take 1 tablet by mouth daily., Disp: , Rfl:  .  cyclobenzaprine (FLEXERIL) 10 MG tablet, Take 10 mg by mouth 3 (three) times daily as needed for muscle spasms., Disp: , Rfl:  .  HYDROcodone-acetaminophen (NORCO/VICODIN) 5-325 MG tablet, Take 1 tablet by mouth every 6 (six) hours as needed for moderate pain., Disp: , Rfl:  .  lisinopril (PRINIVIL,ZESTRIL) 40 MG tablet, Take 40 mg by mouth daily., Disp: , Rfl:  .  polyethylene glycol-electrolytes (NULYTELY/GOLYTELY) 420 g solution, Take 4,000 mLs by mouth once., Disp: , Rfl:  .  sildenafil (VIAGRA) 50 MG tablet, Take 50 mg by mouth daily as needed for erectile dysfunction., Disp: , Rfl:    Physical exam:  Vitals:   12/10/18 1315  BP: 118/76  Pulse: 65  Resp: 18  Temp: 99.4 F (37.4 C)  TempSrc: Tympanic  Weight: 164 lb 4.8 oz (74.5 kg)   Physical Exam HENT:     Head: Normocephalic and atraumatic.  Eyes:     Pupils: Pupils are equal, round, and reactive to light.  Neck:     Musculoskeletal: Normal range of motion.  Cardiovascular:     Rate and Rhythm: Normal rate and regular rhythm.     Heart sounds: Normal heart sounds.  Pulmonary:     Effort: Pulmonary effort is normal.     Breath sounds: Normal breath sounds.  Abdominal:     General: Bowel sounds are normal.     Palpations: Abdomen is soft.     Comments: No palpable  hepatosplenomegaly  Skin:    General: Skin is warm and dry.  Neurological:     Mental Status: He is alert and oriented to person, place, and time.        CMP Latest Ref Rng & Units 12/10/2018  Glucose 70 - 99 mg/dL 97  BUN 8 - 23 mg/dL 16  Creatinine 0.61 - 1.24 mg/dL 0.95  Sodium 135 - 145 mmol/L 129(L)  Potassium 3.5 - 5.1 mmol/L 4.1  Chloride 98 - 111 mmol/L 98  CO2 22 - 32 mmol/L 21(L)  Calcium 8.9 - 10.3 mg/dL 9.7  Total Protein 6.5 - 8.1 g/dL 8.1  Total Bilirubin 0.3 - 1.2 mg/dL 1.6(H)  Alkaline Phos 38 - 126 U/L 56  AST 15 - 41 U/L 40  ALT 0 - 44 U/L 49(H)   CBC Latest Ref Rng & Units 12/10/2018  WBC 4.0 - 10.5 K/uL 4.1  Hemoglobin 13.0 - 17.0 g/dL 14.5  Hematocrit 39.0 - 52.0 % 41.5  Platelets 150 - 400 K/uL 132(L)    No images are attached to the encounter.  No results found.  Assessment and plan- Patient is a 63 y.o. 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  Hemochromatosis often coexists in this condition.  With regards to elevated ferritin-I will check CBC, CMP, ferritin and iron studies. Porphyria cutanea tarda can sometimes coexist with hereditary hemochromatosis and I will order HFE gene testing.  I will order total plasma porphyrin along with HIV, hepatitis B and hepatitis C at this time. Also check fractionated porphyrins and urine total porphyrins.  I encouraged the patient to quit smoking as well as cut down on the use of alcohol as this can further cause worsening of his LFTs in the setting of PCT as well.   Plan is to proceed with phlebotomy Q2 weeks with a goal ferritin of <25. Once we reach that target he will need maintenance phlebotomy to keep ferritin <100  I will see him back in 4  weeks to discuss his results and further management  Thank you for this kind referral and the opportunity to participate in the care of this patient   Visit Diagnosis 1. Elevated ferritin level     Dr. Randa Evens, MD, MPH [facility] [phone] 12/15/2018

## 2018-12-16 ENCOUNTER — Other Ambulatory Visit: Payer: Self-pay

## 2018-12-16 ENCOUNTER — Other Ambulatory Visit: Payer: Self-pay | Admitting: *Deleted

## 2018-12-16 DIAGNOSIS — R7989 Other specified abnormal findings of blood chemistry: Secondary | ICD-10-CM

## 2018-12-17 ENCOUNTER — Inpatient Hospital Stay: Payer: Medicare Other

## 2018-12-17 ENCOUNTER — Other Ambulatory Visit: Payer: Self-pay

## 2018-12-17 ENCOUNTER — Other Ambulatory Visit: Payer: Self-pay | Admitting: *Deleted

## 2018-12-17 VITALS — BP 122/75 | HR 62 | Resp 18

## 2018-12-17 DIAGNOSIS — R7989 Other specified abnormal findings of blood chemistry: Secondary | ICD-10-CM

## 2018-12-17 DIAGNOSIS — E871 Hypo-osmolality and hyponatremia: Secondary | ICD-10-CM

## 2018-12-17 LAB — BASIC METABOLIC PANEL
Anion gap: 11 (ref 5–15)
BUN: 13 mg/dL (ref 8–23)
CO2: 20 mmol/L — ABNORMAL LOW (ref 22–32)
Calcium: 9.2 mg/dL (ref 8.9–10.3)
Chloride: 101 mmol/L (ref 98–111)
Creatinine, Ser: 0.9 mg/dL (ref 0.61–1.24)
GFR calc Af Amer: >60 mL/min (ref >60)
GFR calc non Af Amer: >60 mL/min (ref >60)
Glucose, Bld: 95 mg/dL (ref 70–99)
Potassium: 4.2 mmol/L (ref 3.5–5.1)
Sodium: 132 mmol/L — ABNORMAL LOW (ref 135–145)

## 2018-12-17 LAB — CBC
HCT: 35.5 % — ABNORMAL LOW (ref 39.0–52.0)
Hemoglobin: 12.7 g/dL — ABNORMAL LOW (ref 13.0–17.0)
MCH: 37.4 pg — ABNORMAL HIGH (ref 26.0–34.0)
MCHC: 35.8 g/dL (ref 30.0–36.0)
MCV: 104.4 fL — ABNORMAL HIGH (ref 80.0–100.0)
Platelets: 140 10*3/uL — ABNORMAL LOW (ref 150–400)
RBC: 3.4 MIL/uL — ABNORMAL LOW (ref 4.22–5.81)
RDW: 12.1 % (ref 11.5–15.5)
WBC: 3.6 10*3/uL — ABNORMAL LOW (ref 4.0–10.5)
nRBC: 0 % (ref 0.0–0.2)

## 2018-12-17 NOTE — Progress Notes (Signed)
1430: Hemoglobin 12.7, HCT 35.5, no ferritin collected 12/17/2018. Dr. Janese Banks aware and per Dr. Janese Banks proceed with scheduled phlebotomy. Pt denies any complaints at this time.   Therapeutic phlebotomy performed per MD order removing 250cc using 20 gauge PIV in right AC beginning at 1445 and ending at 1451. Drink and snack offered, pt declines snack, drink provided. Pt tolerated procedure well. Pt and VS stable at discharge, pt denies any complaints.

## 2018-12-20 LAB — MISC LABCORP TEST (SEND OUT): LabCorp test name: 10165

## 2018-12-21 LAB — HEMOCHROMATOSIS DNA-PCR(C282Y,H63D)

## 2018-12-24 ENCOUNTER — Other Ambulatory Visit: Payer: Self-pay

## 2018-12-24 ENCOUNTER — Inpatient Hospital Stay: Payer: Medicare Other

## 2018-12-24 DIAGNOSIS — R7989 Other specified abnormal findings of blood chemistry: Secondary | ICD-10-CM

## 2018-12-24 LAB — CBC
HCT: 35.3 % — ABNORMAL LOW (ref 39.0–52.0)
Hemoglobin: 12.3 g/dL — ABNORMAL LOW (ref 13.0–17.0)
MCH: 36.8 pg — ABNORMAL HIGH (ref 26.0–34.0)
MCHC: 34.8 g/dL (ref 30.0–36.0)
MCV: 105.7 fL — ABNORMAL HIGH (ref 80.0–100.0)
Platelets: 147 10*3/uL — ABNORMAL LOW (ref 150–400)
RBC: 3.34 MIL/uL — ABNORMAL LOW (ref 4.22–5.81)
RDW: 12.5 % (ref 11.5–15.5)
WBC: 3.6 10*3/uL — ABNORMAL LOW (ref 4.0–10.5)
nRBC: 0 % (ref 0.0–0.2)

## 2018-12-24 LAB — FERRITIN: Ferritin: 480 ng/mL — ABNORMAL HIGH (ref 24–336)

## 2018-12-24 NOTE — Progress Notes (Signed)
Per Dr. Janese Banks it is ok to proceed with phlebotomy today. Still waiting on Ferritin levels.

## 2018-12-30 ENCOUNTER — Other Ambulatory Visit: Payer: Self-pay

## 2018-12-31 ENCOUNTER — Other Ambulatory Visit: Payer: Self-pay

## 2018-12-31 ENCOUNTER — Inpatient Hospital Stay: Payer: Medicare Other | Attending: Oncology

## 2018-12-31 ENCOUNTER — Inpatient Hospital Stay: Payer: Medicare Other

## 2018-12-31 VITALS — BP 118/78 | HR 64 | Resp 18

## 2018-12-31 DIAGNOSIS — I10 Essential (primary) hypertension: Secondary | ICD-10-CM | POA: Insufficient documentation

## 2018-12-31 DIAGNOSIS — Z79899 Other long term (current) drug therapy: Secondary | ICD-10-CM | POA: Insufficient documentation

## 2018-12-31 DIAGNOSIS — F17210 Nicotine dependence, cigarettes, uncomplicated: Secondary | ICD-10-CM | POA: Diagnosis not present

## 2018-12-31 DIAGNOSIS — F419 Anxiety disorder, unspecified: Secondary | ICD-10-CM | POA: Insufficient documentation

## 2018-12-31 DIAGNOSIS — D72819 Decreased white blood cell count, unspecified: Secondary | ICD-10-CM | POA: Insufficient documentation

## 2018-12-31 DIAGNOSIS — R7989 Other specified abnormal findings of blood chemistry: Secondary | ICD-10-CM | POA: Diagnosis not present

## 2018-12-31 LAB — CBC
HCT: 36.3 % — ABNORMAL LOW (ref 39.0–52.0)
Hemoglobin: 13 g/dL (ref 13.0–17.0)
MCH: 37.5 pg — ABNORMAL HIGH (ref 26.0–34.0)
MCHC: 35.8 g/dL (ref 30.0–36.0)
MCV: 104.6 fL — ABNORMAL HIGH (ref 80.0–100.0)
Platelets: 150 10*3/uL (ref 150–400)
RBC: 3.47 MIL/uL — ABNORMAL LOW (ref 4.22–5.81)
RDW: 12.6 % (ref 11.5–15.5)
WBC: 3.9 10*3/uL — ABNORMAL LOW (ref 4.0–10.5)
nRBC: 0 % (ref 0.0–0.2)

## 2018-12-31 LAB — FERRITIN: Ferritin: 424 ng/mL — ABNORMAL HIGH (ref 24–336)

## 2018-12-31 NOTE — Progress Notes (Signed)
Per Dr. Janese Banks, proceed with phlebotomy today, based on previous ferritin level.

## 2019-01-07 ENCOUNTER — Inpatient Hospital Stay (HOSPITAL_BASED_OUTPATIENT_CLINIC_OR_DEPARTMENT_OTHER): Payer: Medicare Other | Admitting: Oncology

## 2019-01-07 ENCOUNTER — Inpatient Hospital Stay: Payer: Medicare Other

## 2019-01-07 ENCOUNTER — Inpatient Hospital Stay: Payer: Medicare Other | Admitting: Oncology

## 2019-01-07 ENCOUNTER — Encounter: Payer: Self-pay | Admitting: Oncology

## 2019-01-07 ENCOUNTER — Other Ambulatory Visit: Payer: Self-pay

## 2019-01-07 VITALS — BP 142/85 | HR 68 | Resp 18

## 2019-01-07 DIAGNOSIS — R7989 Other specified abnormal findings of blood chemistry: Secondary | ICD-10-CM

## 2019-01-07 LAB — CBC
HCT: 37.1 % — ABNORMAL LOW (ref 39.0–52.0)
Hemoglobin: 12.8 g/dL — ABNORMAL LOW (ref 13.0–17.0)
MCH: 36.4 pg — ABNORMAL HIGH (ref 26.0–34.0)
MCHC: 34.5 g/dL (ref 30.0–36.0)
MCV: 105.4 fL — ABNORMAL HIGH (ref 80.0–100.0)
Platelets: 150 10*3/uL (ref 150–400)
RBC: 3.52 MIL/uL — ABNORMAL LOW (ref 4.22–5.81)
RDW: 12.9 % (ref 11.5–15.5)
WBC: 2.7 10*3/uL — ABNORMAL LOW (ref 4.0–10.5)
nRBC: 0 % (ref 0.0–0.2)

## 2019-01-07 LAB — FERRITIN: Ferritin: 429 ng/mL — ABNORMAL HIGH (ref 24–336)

## 2019-01-07 NOTE — Progress Notes (Signed)
Today's Ferritin result pending. Notified MD, Dr. Janese Banks. Per MD, Dr. Janese Banks, order: do not wait for today's Ferritin result, proceed with 215ml phlebotomy at this time. Order present in supportive therapy plan.

## 2019-01-07 NOTE — Progress Notes (Signed)
Hematology/Oncology Consult note [facility]  Telephone:([phone] [phone]  Patient Care Team: Idelle Crouch, MD as PCP - General (Internal Medicine)   Name of the patient: Shawn Gonzalez  [id number]  01/24/1956   Date of visit: 01/07/19  Diagnosis-porphyria cutanea tarda  Chief complaint/ Reason for visit-discuss results of blood work and for next session of phlebotomy  Heme/Onc history:  Patient is a 63 year old male with a past medical history significant for hypertension, anxiety, chronic back pain and tobacco dependence.  He has had on and off blistering of his bilateral hands and has seen dermatology in the past and was attributed to hematological disorder.  Patient states that he has came to the cancer center many years ago and underwent phlebotomy at that time.  He is unsure if he has hemochromatosis.  Family history of any liver disease.  He does report drinking alcohol sometimes he may have a few beers and 1 go sometimes he may go for days without drink.  He also smokes 1 pack of cigarettes per day for the last 42 years.  Results of blood work from 12/10/2018 showed elevated ferritin of 772.  HIV and hepatitis C testing was negative.  HFE gene testing was negative.   Ref Range & Units 4wk ago  Uroporphyrins (UP) 0 - 20 ug/L 380High    Heptacarboxyl (7-CP) 0 - 2 ug/L 395High    Hexacarboxyl (6-CP) 0 - 1 ug/L <1   Pentacarboxyl (5-CP) 0 - 2 ug/L 38High    Coproporphyrin (CP) I 0 - 15 ug/L 64High    Coproporphyrin (CP) III 0 - 49 ug/L 58High      Interval history-patient reports that he still gets occasional blisters but overall he feels better since phlebotomy has been initiated.  He continues to smoke.  ECOG PS- 1 Pain scale- 0   Review of systems- Review of Systems  Constitutional: Negative for chills, fever, malaise/fatigue and weight loss.       Body appears plethoric  HENT: Negative for congestion, ear discharge and  nosebleeds.   Eyes: Negative for blurred vision.  Respiratory: Negative for cough, hemoptysis, sputum production, shortness of breath and wheezing.   Cardiovascular: Negative for chest pain, palpitations, orthopnea and claudication.  Gastrointestinal: Negative for abdominal pain, blood in stool, constipation, diarrhea, heartburn, melena, nausea and vomiting.  Genitourinary: Negative for dysuria, flank pain, frequency, hematuria and urgency.  Musculoskeletal: Negative for back pain, joint pain and myalgias.  Skin: Negative for rash.       Skin blisters  Neurological: Negative for dizziness, tingling, focal weakness, seizures, weakness and headaches.  Endo/Heme/Allergies: Does not bruise/bleed easily.  Psychiatric/Behavioral: Negative for depression and suicidal ideas. The patient does not have insomnia.       Allergies  Allergen Reactions  . Celebrex [Celecoxib] Nausea Only     Past Medical History:  Diagnosis Date  . Anxiety   . Chronic low back pain   . Depression   . Elevated ferritin   . Erectile dysfunction   . History of chicken pox   . Hypertension   . Insomnia      Past Surgical History:  Procedure Laterality Date  . COLONOSCOPY WITH PROPOFOL N/A 10/31/2016   Procedure: COLONOSCOPY WITH PROPOFOL;  Surgeon: Manya Silvas, MD;  Location: [facility] ENDOSCOPY;  Service: Endoscopy;  Laterality: N/A;  . FINGER SURGERY Right    RT.INDEX  . MULTIPLE TOOTH EXTRACTIONS      Social History   Socioeconomic History  .  Marital status: Married    Spouse name: Not on file  . Number of children: Not on file  . Years of education: Not on file  . Highest education level: Not on file  Occupational History  . Not on file  Social Needs  . Financial resource strain: Not on file  . Food insecurity    Worry: Not on file    Inability: Not on file  . Transportation needs    Medical: Not on file    Non-medical: Not on file  Tobacco Use  . Smoking status: Current Every Day Smoker     Packs/day: 1.00    Years: 42.00    Pack years: 42.00    Types: Cigarettes  . Smokeless tobacco: Never Used  Substance and Sexual Activity  . Alcohol use: Yes  . Drug use: No  . Sexual activity: Not on file  Lifestyle  . Physical activity    Days per week: Not on file    Minutes per session: Not on file  . Stress: Not on file  Relationships  . Social Herbalist on phone: Not on file    Gets together: Not on file    Attends religious service: Not on file    Active member of club or organization: Not on file    Attends meetings of clubs or organizations: Not on file    Relationship status: Not on file  . Intimate partner violence    Fear of current or ex partner: Not on file    Emotionally abused: Not on file    Physically abused: Not on file    Forced sexual activity: Not on file  Other Topics Concern  . Not on file  Social History Narrative  . Not on file    History reviewed. No pertinent family history.   Current Outpatient Medications:  .  ALPRAZolam (XANAX) 0.25 MG tablet, Take 0.25 mg by mouth 2 (two) times daily as needed for anxiety or sleep., Disp: , Rfl:  .  amLODipine (NORVASC) 10 MG tablet, Take 10 mg by mouth daily., Disp: , Rfl:  .  bisoprolol-hydrochlorothiazide (ZIAC) 5-6.25 MG tablet, Take 1 tablet by mouth daily., Disp: , Rfl:  .  cyclobenzaprine (FLEXERIL) 10 MG tablet, Take 10 mg by mouth 3 (three) times daily as needed for muscle spasms., Disp: , Rfl:  .  HYDROcodone-acetaminophen (NORCO/VICODIN) 5-325 MG tablet, Take 1 tablet by mouth every 6 (six) hours as needed for moderate pain., Disp: , Rfl:  .  lisinopril (PRINIVIL,ZESTRIL) 40 MG tablet, Take 40 mg by mouth daily., Disp: , Rfl:   Physical exam:  Vitals:   01/07/19 1016  BP: (!) 152/87  Pulse: 66  Resp: 16  Temp: 98.1 F (36.7 C)  TempSrc: Tympanic  Weight: 167 lb 1.6 oz (75.8 kg)   Physical Exam Constitutional:      Comments: Body appears plethoric  HENT:     Head:  Normocephalic and atraumatic.  Eyes:     Pupils: Pupils are equal, round, and reactive to light.  Neck:     Musculoskeletal: Normal range of motion.  Cardiovascular:     Rate and Rhythm: Normal rate and regular rhythm.     Heart sounds: Normal heart sounds.  Pulmonary:     Effort: Pulmonary effort is normal.     Breath sounds: Normal breath sounds.  Abdominal:     General: Bowel sounds are normal.     Palpations: Abdomen is soft.  Skin:  General: Skin is warm and dry.  Neurological:     Mental Status: He is alert and oriented to person, place, and time.      CMP Latest Ref Rng & Units 12/17/2018  Glucose 70 - 99 mg/dL 95  BUN 8 - 23 mg/dL 13  Creatinine 0.61 - 1.24 mg/dL 0.90  Sodium 135 - 145 mmol/L 132(L)  Potassium 3.5 - 5.1 mmol/L 4.2  Chloride 98 - 111 mmol/L 101  CO2 22 - 32 mmol/L 20(L)  Calcium 8.9 - 10.3 mg/dL 9.2  Total Protein 6.5 - 8.1 g/dL -  Total Bilirubin 0.3 - 1.2 mg/dL -  Alkaline Phos 38 - 126 U/L -  AST 15 - 41 U/L -  ALT 0 - 44 U/L -   CBC Latest Ref Rng & Units 01/07/2019  WBC 4.0 - 10.5 K/uL 2.7(L)  Hemoglobin 13.0 - 17.0 g/dL 12.8(L)  Hematocrit 39.0 - 52.0 % 37.1(L)  Platelets 150 - 400 K/uL 150    No images are attached to the encounter.  No results found.   Assessment and plan- Patient is a 63 y.o. male with history of porphyria cutanea tarda referred for elevated ferritin  1.  I discussed the results of the blood work with the patient in detail which is consistent with porphyria cutanea tarda.  I discussed the pathophysiology of porphyria cutanea tarda with the patient and its propensity to cause elevated ferritin as well as skin blisters.  Discussed wearing skin protection when he goes out.  Strongly advised him to abstain from alcohol and smoking which can potentially worsen underlying skin blistering as well as liver damage.  Advised him to avoid iron rich foods and make sure he does not take any additional supplemental iron.  He does  not have coexisting hemochromatosis.  We will continue weekly phlebotomies until his ferritin is less than 25.  Following that he will need maintenance phlebotomy to keep his ferritin less than 100.  Patient verbalized understanding  Patient will continue to get weekly phlebotomy.  Presently his ferritin has come down from the 700-400.  2.  Patient noted to have waxing and waning leukopenia and today his white count is 2.7.  I will add a differential to his CBC when he gets it done weekly.  If there is a consistent downward trend in his white count I will consider a bone marrow biopsy at that time   Visit Diagnosis 1. Porphyria cutanea tarda ([place])   2. Iron overload   3. Elevated ferritin level      Dr. Randa Evens, MD, MPH [facility] at [facility] [phone] 01/07/2019 11:00 AM

## 2019-01-07 NOTE — Progress Notes (Signed)
Pt does not have any issues with getting phlebotomy. He works outside a lot at home and drinks water and gatorade.. he wanted to know if he is doing better and I shared with him the ferritin and it was going down but still has a ways to go to get it down more. The ferritin for today is not back yet

## 2019-01-08 LAB — PORPHYRINS, FRACTIONATION-PLASMA
Coproporphyrin.: <1 ug/dL (ref 0.0–1.0)
Heptacarboxyl Porphyrins: 4.1 ug/dL — ABNORMAL HIGH (ref 0.0–1.0)
Hexacarboxyl Porphyrins: 3 ug/dL — ABNORMAL HIGH (ref 0.0–1.0)
Pentacarboxyl Porphyrins: <1 ug/dL (ref 0.0–1.0)
Protoporphyrin: <1 ug/dL (ref 0.0–1.0)
Uroporphyrin: 4.1 ug/dL — ABNORMAL HIGH (ref 0.0–1.0)

## 2019-01-14 ENCOUNTER — Inpatient Hospital Stay: Payer: Medicare Other

## 2019-01-14 ENCOUNTER — Other Ambulatory Visit: Payer: Self-pay

## 2019-01-14 ENCOUNTER — Encounter (INDEPENDENT_AMBULATORY_CARE_PROVIDER_SITE_OTHER): Payer: Self-pay

## 2019-01-14 ENCOUNTER — Other Ambulatory Visit: Payer: Self-pay | Admitting: Oncology

## 2019-01-14 VITALS — BP 146/82 | HR 64 | Temp 97.9°F | Resp 20

## 2019-01-14 DIAGNOSIS — R7989 Other specified abnormal findings of blood chemistry: Secondary | ICD-10-CM

## 2019-01-14 LAB — CBC WITH DIFFERENTIAL/PLATELET
Abs Immature Granulocytes: 0.02 10*3/uL (ref 0.00–0.07)
Basophils Absolute: 0 10*3/uL (ref 0.0–0.1)
Basophils Relative: 1 %
Eosinophils Absolute: 0.1 10*3/uL (ref 0.0–0.5)
Eosinophils Relative: 3 %
HCT: 37.7 % — ABNORMAL LOW (ref 39.0–52.0)
Hemoglobin: 13.1 g/dL (ref 13.0–17.0)
Immature Granulocytes: 1 %
Lymphocytes Relative: 23 %
Lymphs Abs: 0.9 10*3/uL (ref 0.7–4.0)
MCH: 37.1 pg — ABNORMAL HIGH (ref 26.0–34.0)
MCHC: 34.7 g/dL (ref 30.0–36.0)
MCV: 106.8 fL — ABNORMAL HIGH (ref 80.0–100.0)
Monocytes Absolute: 0.5 10*3/uL (ref 0.1–1.0)
Monocytes Relative: 13 %
Neutro Abs: 2.4 10*3/uL (ref 1.7–7.7)
Neutrophils Relative %: 59 %
Platelets: 153 10*3/uL (ref 150–400)
RBC: 3.53 MIL/uL — ABNORMAL LOW (ref 4.22–5.81)
RDW: 13.1 % (ref 11.5–15.5)
WBC: 3.9 10*3/uL — ABNORMAL LOW (ref 4.0–10.5)
nRBC: 0 % (ref 0.0–0.2)

## 2019-01-14 LAB — FERRITIN: Ferritin: 419 ng/mL — ABNORMAL HIGH (ref 24–336)

## 2019-01-14 NOTE — Progress Notes (Signed)
CBC results back. Today's Ferritin result pending. Last Ferritin result was 429 on 01/07/2019. Notified MD, Dr. Janese Banks. Per MD order: do not wait for today's Ferritin result, proceed with 241ml phlebotomy at this time. Order present in supportive therapy plan.

## 2019-01-19 ENCOUNTER — Other Ambulatory Visit: Payer: Self-pay

## 2019-01-19 DIAGNOSIS — R7989 Other specified abnormal findings of blood chemistry: Secondary | ICD-10-CM

## 2019-01-20 ENCOUNTER — Other Ambulatory Visit: Payer: Self-pay

## 2019-01-21 ENCOUNTER — Inpatient Hospital Stay: Payer: Medicare Other

## 2019-01-21 ENCOUNTER — Other Ambulatory Visit: Payer: Self-pay

## 2019-01-21 VITALS — BP 133/74 | HR 68 | Temp 98.0°F | Resp 20

## 2019-01-21 DIAGNOSIS — R7989 Other specified abnormal findings of blood chemistry: Secondary | ICD-10-CM

## 2019-01-21 LAB — CBC WITH DIFFERENTIAL/PLATELET
Abs Immature Granulocytes: 0.02 10*3/uL (ref 0.00–0.07)
Basophils Absolute: 0 10*3/uL (ref 0.0–0.1)
Basophils Relative: 1 %
Eosinophils Absolute: 0.1 10*3/uL (ref 0.0–0.5)
Eosinophils Relative: 3 %
HCT: 38.3 % — ABNORMAL LOW (ref 39.0–52.0)
Hemoglobin: 13.5 g/dL (ref 13.0–17.0)
Immature Granulocytes: 1 %
Lymphocytes Relative: 21 %
Lymphs Abs: 0.8 10*3/uL (ref 0.7–4.0)
MCH: 37.9 pg — ABNORMAL HIGH (ref 26.0–34.0)
MCHC: 35.2 g/dL (ref 30.0–36.0)
MCV: 107.6 fL — ABNORMAL HIGH (ref 80.0–100.0)
Monocytes Absolute: 0.5 10*3/uL (ref 0.1–1.0)
Monocytes Relative: 11 %
Neutro Abs: 2.5 10*3/uL (ref 1.7–7.7)
Neutrophils Relative %: 63 %
Platelets: 161 10*3/uL (ref 150–400)
RBC: 3.56 MIL/uL — ABNORMAL LOW (ref 4.22–5.81)
RDW: 13 % (ref 11.5–15.5)
WBC: 3.9 10*3/uL — ABNORMAL LOW (ref 4.0–10.5)
nRBC: 0 % (ref 0.0–0.2)

## 2019-01-21 LAB — FERRITIN: Ferritin: 337 ng/mL — ABNORMAL HIGH (ref 24–336)

## 2019-01-21 NOTE — Progress Notes (Signed)
Ferritin on 8/21 419. Per Dr Janese Banks proceed with phlebotomy based on these results.

## 2019-01-27 ENCOUNTER — Other Ambulatory Visit: Payer: Self-pay

## 2019-01-27 DIAGNOSIS — R7989 Other specified abnormal findings of blood chemistry: Secondary | ICD-10-CM

## 2019-01-28 ENCOUNTER — Other Ambulatory Visit: Payer: Self-pay

## 2019-01-28 ENCOUNTER — Inpatient Hospital Stay: Payer: Medicare Other

## 2019-01-28 ENCOUNTER — Inpatient Hospital Stay: Payer: Medicare Other | Attending: Oncology

## 2019-01-28 VITALS — BP 124/76 | HR 69 | Resp 20

## 2019-01-28 DIAGNOSIS — Z79899 Other long term (current) drug therapy: Secondary | ICD-10-CM | POA: Insufficient documentation

## 2019-01-28 DIAGNOSIS — D539 Nutritional anemia, unspecified: Secondary | ICD-10-CM | POA: Insufficient documentation

## 2019-01-28 DIAGNOSIS — F419 Anxiety disorder, unspecified: Secondary | ICD-10-CM | POA: Insufficient documentation

## 2019-01-28 DIAGNOSIS — I10 Essential (primary) hypertension: Secondary | ICD-10-CM | POA: Insufficient documentation

## 2019-01-28 DIAGNOSIS — F17210 Nicotine dependence, cigarettes, uncomplicated: Secondary | ICD-10-CM | POA: Insufficient documentation

## 2019-01-28 DIAGNOSIS — R7989 Other specified abnormal findings of blood chemistry: Secondary | ICD-10-CM

## 2019-01-28 LAB — FERRITIN: Ferritin: 272 ng/mL (ref 24–336)

## 2019-01-28 LAB — CBC
HCT: 38.3 % — ABNORMAL LOW (ref 39.0–52.0)
Hemoglobin: 13.4 g/dL (ref 13.0–17.0)
MCH: 37.5 pg — ABNORMAL HIGH (ref 26.0–34.0)
MCHC: 35 g/dL (ref 30.0–36.0)
MCV: 107.3 fL — ABNORMAL HIGH (ref 80.0–100.0)
Platelets: 153 10*3/uL (ref 150–400)
RBC: 3.57 MIL/uL — ABNORMAL LOW (ref 4.22–5.81)
RDW: 12.9 % (ref 11.5–15.5)
WBC: 4 10*3/uL (ref 4.0–10.5)
nRBC: 0 % (ref 0.0–0.2)

## 2019-02-03 ENCOUNTER — Other Ambulatory Visit: Payer: Self-pay

## 2019-02-04 ENCOUNTER — Other Ambulatory Visit: Payer: Self-pay

## 2019-02-04 ENCOUNTER — Inpatient Hospital Stay: Payer: Medicare Other

## 2019-02-04 DIAGNOSIS — R7989 Other specified abnormal findings of blood chemistry: Secondary | ICD-10-CM

## 2019-02-04 LAB — FERRITIN: Ferritin: 269 ng/mL (ref 24–336)

## 2019-02-04 LAB — CBC
HCT: 38 % — ABNORMAL LOW (ref 39.0–52.0)
Hemoglobin: 13.4 g/dL (ref 13.0–17.0)
MCH: 37.4 pg — ABNORMAL HIGH (ref 26.0–34.0)
MCHC: 35.3 g/dL (ref 30.0–36.0)
MCV: 106.1 fL — ABNORMAL HIGH (ref 80.0–100.0)
Platelets: 146 10*3/uL — ABNORMAL LOW (ref 150–400)
RBC: 3.58 MIL/uL — ABNORMAL LOW (ref 4.22–5.81)
RDW: 12.4 % (ref 11.5–15.5)
WBC: 4.1 10*3/uL (ref 4.0–10.5)
nRBC: 0 % (ref 0.0–0.2)

## 2019-02-07 ENCOUNTER — Inpatient Hospital Stay: Payer: Medicare Other | Admitting: Oncology

## 2019-02-07 ENCOUNTER — Inpatient Hospital Stay: Payer: Medicare Other

## 2019-02-11 ENCOUNTER — Other Ambulatory Visit: Payer: Self-pay | Admitting: *Deleted

## 2019-02-11 ENCOUNTER — Other Ambulatory Visit: Payer: Self-pay

## 2019-02-11 ENCOUNTER — Inpatient Hospital Stay: Payer: Medicare Other

## 2019-02-11 ENCOUNTER — Inpatient Hospital Stay (HOSPITAL_BASED_OUTPATIENT_CLINIC_OR_DEPARTMENT_OTHER): Payer: Medicare Other | Admitting: Oncology

## 2019-02-11 ENCOUNTER — Encounter: Payer: Self-pay | Admitting: Oncology

## 2019-02-11 VITALS — BP 138/82 | HR 64 | Resp 20

## 2019-02-11 DIAGNOSIS — R7989 Other specified abnormal findings of blood chemistry: Secondary | ICD-10-CM

## 2019-02-11 DIAGNOSIS — D539 Nutritional anemia, unspecified: Secondary | ICD-10-CM

## 2019-02-11 LAB — CBC
HCT: 38.7 % — ABNORMAL LOW (ref 39.0–52.0)
Hemoglobin: 13.6 g/dL (ref 13.0–17.0)
MCH: 37.6 pg — ABNORMAL HIGH (ref 26.0–34.0)
MCHC: 35.1 g/dL (ref 30.0–36.0)
MCV: 106.9 fL — ABNORMAL HIGH (ref 80.0–100.0)
Platelets: 167 10*3/uL (ref 150–400)
RBC: 3.62 MIL/uL — ABNORMAL LOW (ref 4.22–5.81)
RDW: 12.6 % (ref 11.5–15.5)
WBC: 4.9 10*3/uL (ref 4.0–10.5)
nRBC: 0 % (ref 0.0–0.2)

## 2019-02-11 LAB — FERRITIN: Ferritin: 225 ng/mL (ref 24–336)

## 2019-02-11 NOTE — Progress Notes (Signed)
Pt in for follow up, denies any concerns today.  Pt states his brother passed away and today was the funeral.  Pt is emotional.

## 2019-02-14 DIAGNOSIS — D539 Nutritional anemia, unspecified: Secondary | ICD-10-CM | POA: Insufficient documentation

## 2019-02-14 NOTE — Progress Notes (Signed)
Hematology/Oncology Consult note [facility]  [phone] [phone]  Patient Care Team: Idelle Crouch, MD as PCP - General (Internal Medicine)   Name of the patient: Zyen Acker  [id number]  1955/11/26   Date of visit: 02/14/19  Diagnosis-porphyria cutanea tarda  Chief complaint/ Reason for visit-routine follow-up of PCT for phlebotomy  Heme/Onc history: Patient is a 63 year old male with a past medical history significant for hypertension, anxiety, chronic back pain and tobacco dependence. He has had on and off blistering of his bilateral hands and has seen dermatology in the past and was attributed to hematological disorder. Patient states that he has came to the cancer center many years ago and underwent phlebotomy at that time. He is unsure if he has hemochromatosis. Family history of any liver disease. He does report drinking alcohol sometimes he may have a few beers and 1 go sometimes he may go for days without drink. He also smokes 1 pack of cigarettes per day for the last 42 years.  Results of blood work from 12/10/2018 showed elevated ferritin of 772.  HIV and hepatitis C testing was negative.  HFE gene testing was negative.   Ref Range & Units 4wk ago  Uroporphyrins (UP) 0 - 20 ug/L 380High    Heptacarboxyl (7-CP) 0 - 2 ug/L 395High    Hexacarboxyl (6-CP) 0 - 1 ug/L <1   Pentacarboxyl (5-CP) 0 - 2 ug/L 38High    Coproporphyrin (CP) I 0 - 15 ug/L 64High    Coproporphyrin (CP) III 0 - 49 ug/L 58High      Overall results consistent with porphyria cutanea tarda and patient was started on phlebotomy to keep initial ferritin less than 25.  Interval history-patient just lost his brother a few days ago to a heart attack.  He still grieving the loss of his brother.  From his own health standpoint he is doing well.  Appetite and weight have remained stable.  He has 1 or 2 blisters over his hands but no new lesions.   Reports continuing to drink occasional alcohol.  ECOG PS- 1 Pain scale- 0   Review of systems- Review of Systems  Constitutional: Positive for malaise/fatigue. Negative for chills, fever and weight loss.  HENT: Negative for congestion, ear discharge and nosebleeds.   Eyes: Negative for blurred vision.  Respiratory: Negative for cough, hemoptysis, sputum production, shortness of breath and wheezing.   Cardiovascular: Negative for chest pain, palpitations, orthopnea and claudication.  Gastrointestinal: Negative for abdominal pain, blood in stool, constipation, diarrhea, heartburn, melena, nausea and vomiting.  Genitourinary: Negative for dysuria, flank pain, frequency, hematuria and urgency.  Musculoskeletal: Negative for back pain, joint pain and myalgias.  Skin: Negative for rash.  Neurological: Negative for dizziness, tingling, focal weakness, seizures, weakness and headaches.  Endo/Heme/Allergies: Does not bruise/bleed easily.  Psychiatric/Behavioral: Negative for depression and suicidal ideas. The patient does not have insomnia.      Allergies  Allergen Reactions  . Celebrex [Celecoxib] Nausea Only     Past Medical History:  Diagnosis Date  . Anxiety   . Chronic low back pain   . Depression   . Elevated ferritin   . Erectile dysfunction   . History of chicken pox   . Hypertension   . Insomnia      Past Surgical History:  Procedure Laterality Date  . COLONOSCOPY WITH PROPOFOL N/A 10/31/2016   Procedure: COLONOSCOPY WITH PROPOFOL;  Surgeon: Manya Silvas, MD;  Location: [facility] ENDOSCOPY;  Service: Endoscopy;  Laterality: N/A;  . FINGER SURGERY Right    RT.INDEX  . MULTIPLE TOOTH EXTRACTIONS      Social History   Socioeconomic History  . Marital status: Married    Spouse name: Not on file  . Number of children: Not on file  . Years of education: Not on file  . Highest education level: Not on file  Occupational History  . Not on file  Social Needs  .  Financial resource strain: Not on file  . Food insecurity    Worry: Not on file    Inability: Not on file  . Transportation needs    Medical: Not on file    Non-medical: Not on file  Tobacco Use  . Smoking status: Current Every Day Smoker    Packs/day: 1.00    Years: 42.00    Pack years: 42.00    Types: Cigarettes  . Smokeless tobacco: Never Used  Substance and Sexual Activity  . Alcohol use: Yes  . Drug use: No  . Sexual activity: Not on file  Lifestyle  . Physical activity    Days per week: Not on file    Minutes per session: Not on file  . Stress: Not on file  Relationships  . Social Herbalist on phone: Not on file    Gets together: Not on file    Attends religious service: Not on file    Active member of club or organization: Not on file    Attends meetings of clubs or organizations: Not on file    Relationship status: Not on file  . Intimate partner violence    Fear of current or ex partner: Not on file    Emotionally abused: Not on file    Physically abused: Not on file    Forced sexual activity: Not on file  Other Topics Concern  . Not on file  Social History Narrative  . Not on file    History reviewed. No pertinent family history.   Current Outpatient Medications:  .  ALPRAZolam (XANAX) 0.25 MG tablet, Take 0.25 mg by mouth 2 (two) times daily as needed for anxiety or sleep., Disp: , Rfl:  .  amLODipine (NORVASC) 10 MG tablet, Take 10 mg by mouth daily., Disp: , Rfl:  .  bisoprolol-hydrochlorothiazide (ZIAC) 5-6.25 MG tablet, Take 1 tablet by mouth daily., Disp: , Rfl:  .  cyclobenzaprine (FLEXERIL) 10 MG tablet, Take 10 mg by mouth 3 (three) times daily as needed for muscle spasms., Disp: , Rfl:  .  HYDROcodone-acetaminophen (NORCO/VICODIN) 5-325 MG tablet, Take 1 tablet by mouth every 6 (six) hours as needed for moderate pain., Disp: , Rfl:  .  lisinopril (PRINIVIL,ZESTRIL) 40 MG tablet, Take 40 mg by mouth daily., Disp: , Rfl:   Physical  exam:  Vitals:   02/11/19 1445  BP: (!) 141/86  Pulse: 60  Resp: 18  Temp: 98.1 F (36.7 C)  TempSrc: Tympanic  Weight: 167 lb (75.8 kg)   Physical Exam HENT:     Head: Normocephalic and atraumatic.  Eyes:     Pupils: Pupils are equal, round, and reactive to light.  Neck:     Musculoskeletal: Normal range of motion.  Cardiovascular:     Rate and Rhythm: Normal rate and regular rhythm.     Heart sounds: Normal heart sounds.  Pulmonary:     Effort: Pulmonary effort is normal.     Breath sounds: Normal breath sounds.  Abdominal:  General: Bowel sounds are normal.     Palpations: Abdomen is soft.  Skin:    General: Skin is warm and dry.  Neurological:     Mental Status: He is alert and oriented to person, place, and time.      CMP Latest Ref Rng & Units 12/17/2018  Glucose 70 - 99 mg/dL 95  BUN 8 - 23 mg/dL 13  Creatinine 0.61 - 1.24 mg/dL 0.90  Sodium 135 - 145 mmol/L 132(L)  Potassium 3.5 - 5.1 mmol/L 4.2  Chloride 98 - 111 mmol/L 101  CO2 22 - 32 mmol/L 20(L)  Calcium 8.9 - 10.3 mg/dL 9.2  Total Protein 6.5 - 8.1 g/dL -  Total Bilirubin 0.3 - 1.2 mg/dL -  Alkaline Phos 38 - 126 U/L -  AST 15 - 41 U/L -  ALT 0 - 44 U/L -   CBC Latest Ref Rng & Units 02/11/2019  WBC 4.0 - 10.5 K/uL 4.9  Hemoglobin 13.0 - 17.0 g/dL 13.6  Hematocrit 39.0 - 52.0 % 38.7(L)  Platelets 150 - 400 K/uL 167      Assessment and plan- Patient is a 63 y.o. male with porphyria cutanea tarda here for routine follow-up and ongoing phlebotomy  Patient has been undergoing weekly phlebotomy and tolerating it well.  Goal ferritin is currently less than 25.  Present ferritin is 225 which has come down significantly from prior values which were as high as 700.  We will continue weekly phlebotomies at this time again I have strongly encouraged him to abstain from alcohol.  I suspect he has macrocytosis without anemia may be secondary to alcohol intake.  Patient will get CBC  and ferritin every  week along with phlebotomy and I will see him back in 3 months   Visit Diagnosis 1. Porphyria cutanea tarda ([place])   2. Macrocytic anemia      Dr. Randa Evens, MD, MPH [facility] at [facility] [id number] 02/14/2019 9:04 AM

## 2019-02-18 ENCOUNTER — Other Ambulatory Visit: Payer: Self-pay

## 2019-02-18 ENCOUNTER — Inpatient Hospital Stay: Payer: Medicare Other

## 2019-02-18 VITALS — BP 127/82 | HR 63 | Temp 98.2°F | Resp 18

## 2019-02-18 DIAGNOSIS — R7989 Other specified abnormal findings of blood chemistry: Secondary | ICD-10-CM

## 2019-02-18 LAB — FERRITIN: Ferritin: 217 ng/mL (ref 24–336)

## 2019-02-18 LAB — CBC
HCT: 39.8 % (ref 39.0–52.0)
Hemoglobin: 13.7 g/dL (ref 13.0–17.0)
MCH: 36.9 pg — ABNORMAL HIGH (ref 26.0–34.0)
MCHC: 34.4 g/dL (ref 30.0–36.0)
MCV: 107.3 fL — ABNORMAL HIGH (ref 80.0–100.0)
Platelets: 177 10*3/uL (ref 150–400)
RBC: 3.71 MIL/uL — ABNORMAL LOW (ref 4.22–5.81)
RDW: 12.3 % (ref 11.5–15.5)
WBC: 4.4 10*3/uL (ref 4.0–10.5)
nRBC: 0 % (ref 0.0–0.2)

## 2019-02-18 NOTE — Progress Notes (Signed)
Per Dr. Janese Banks proceed with scheduled 250cc phlebotomy prior to 02/18/2019 Ferritin results. 02/11/2019 Ferritin level: 225.  1450: Therapeutic phlebotomy performed per MD order removing 250cc using a 20 gauge PIV in left AC. Drink provided, pt declines snack. Pt tolerated procedure well.  1500:Pt and VS stable at discharge.

## 2019-02-25 ENCOUNTER — Inpatient Hospital Stay: Payer: Medicare Other

## 2019-02-25 ENCOUNTER — Inpatient Hospital Stay: Payer: Medicare Other | Attending: Oncology

## 2019-02-25 ENCOUNTER — Other Ambulatory Visit: Payer: Self-pay

## 2019-02-25 DIAGNOSIS — F419 Anxiety disorder, unspecified: Secondary | ICD-10-CM | POA: Insufficient documentation

## 2019-02-25 DIAGNOSIS — D539 Nutritional anemia, unspecified: Secondary | ICD-10-CM | POA: Diagnosis not present

## 2019-02-25 DIAGNOSIS — F17210 Nicotine dependence, cigarettes, uncomplicated: Secondary | ICD-10-CM | POA: Insufficient documentation

## 2019-02-25 DIAGNOSIS — I10 Essential (primary) hypertension: Secondary | ICD-10-CM | POA: Insufficient documentation

## 2019-02-25 DIAGNOSIS — Z79899 Other long term (current) drug therapy: Secondary | ICD-10-CM | POA: Insufficient documentation

## 2019-02-25 DIAGNOSIS — R7989 Other specified abnormal findings of blood chemistry: Secondary | ICD-10-CM

## 2019-02-25 LAB — CBC
HCT: 38 % — ABNORMAL LOW (ref 39.0–52.0)
Hemoglobin: 13.4 g/dL (ref 13.0–17.0)
MCH: 37 pg — ABNORMAL HIGH (ref 26.0–34.0)
MCHC: 35.3 g/dL (ref 30.0–36.0)
MCV: 105 fL — ABNORMAL HIGH (ref 80.0–100.0)
Platelets: 170 10*3/uL (ref 150–400)
RBC: 3.62 MIL/uL — ABNORMAL LOW (ref 4.22–5.81)
RDW: 12 % (ref 11.5–15.5)
WBC: 5.5 10*3/uL (ref 4.0–10.5)
nRBC: 0 % (ref 0.0–0.2)

## 2019-02-25 LAB — FERRITIN: Ferritin: 210 ng/mL (ref 24–336)

## 2019-02-25 LAB — FOLATE: Folate: 7.4 ng/mL

## 2019-02-25 LAB — VITAMIN B12: Vitamin B-12: 99 pg/mL — ABNORMAL LOW (ref 180–914)

## 2019-02-27 ENCOUNTER — Other Ambulatory Visit: Payer: Self-pay | Admitting: Oncology

## 2019-02-28 NOTE — Progress Notes (Signed)
Pt ok to get b12 inj. As long as they are on the appt days that he has been set up already for phlebotomy. That is the plan

## 2019-03-03 ENCOUNTER — Other Ambulatory Visit: Payer: Self-pay

## 2019-03-04 ENCOUNTER — Inpatient Hospital Stay: Payer: Medicare Other

## 2019-03-04 ENCOUNTER — Other Ambulatory Visit: Payer: Self-pay

## 2019-03-04 VITALS — BP 121/79 | HR 80 | Resp 20

## 2019-03-04 DIAGNOSIS — R7989 Other specified abnormal findings of blood chemistry: Secondary | ICD-10-CM

## 2019-03-04 LAB — CBC
HCT: 36.6 % — ABNORMAL LOW (ref 39.0–52.0)
Hemoglobin: 12.8 g/dL — ABNORMAL LOW (ref 13.0–17.0)
MCH: 36.8 pg — ABNORMAL HIGH (ref 26.0–34.0)
MCHC: 35 g/dL (ref 30.0–36.0)
MCV: 105.2 fL — ABNORMAL HIGH (ref 80.0–100.0)
Platelets: 166 10*3/uL (ref 150–400)
RBC: 3.48 MIL/uL — ABNORMAL LOW (ref 4.22–5.81)
RDW: 11.9 % (ref 11.5–15.5)
WBC: 3.8 10*3/uL — ABNORMAL LOW (ref 4.0–10.5)
nRBC: 0 % (ref 0.0–0.2)

## 2019-03-04 LAB — FERRITIN: Ferritin: 236 ng/mL (ref 24–336)

## 2019-03-04 MED ORDER — CYANOCOBALAMIN 1000 MCG/ML IJ SOLN
1000.0000 ug | INTRAMUSCULAR | Status: DC
  Administered 2019-03-04: 1000 ug via INTRAMUSCULAR

## 2019-03-10 ENCOUNTER — Other Ambulatory Visit: Payer: Self-pay

## 2019-03-11 ENCOUNTER — Inpatient Hospital Stay: Payer: Medicare Other

## 2019-03-11 ENCOUNTER — Other Ambulatory Visit: Payer: Self-pay

## 2019-03-11 VITALS — BP 143/84 | HR 65 | Resp 18

## 2019-03-11 DIAGNOSIS — R7989 Other specified abnormal findings of blood chemistry: Secondary | ICD-10-CM

## 2019-03-11 LAB — CBC
HCT: 37.2 % — ABNORMAL LOW (ref 39.0–52.0)
Hemoglobin: 12.8 g/dL — ABNORMAL LOW (ref 13.0–17.0)
MCH: 36.4 pg — ABNORMAL HIGH (ref 26.0–34.0)
MCHC: 34.4 g/dL (ref 30.0–36.0)
MCV: 105.7 fL — ABNORMAL HIGH (ref 80.0–100.0)
Platelets: 176 10*3/uL (ref 150–400)
RBC: 3.52 MIL/uL — ABNORMAL LOW (ref 4.22–5.81)
RDW: 12.2 % (ref 11.5–15.5)
WBC: 4.7 10*3/uL (ref 4.0–10.5)
nRBC: 0 % (ref 0.0–0.2)

## 2019-03-11 LAB — FERRITIN: Ferritin: 159 ng/mL (ref 24–336)

## 2019-03-11 MED ORDER — CYANOCOBALAMIN 1000 MCG/ML IJ SOLN
1000.0000 ug | INTRAMUSCULAR | Status: DC
  Administered 2019-03-11: 15:00:00 1000 ug via INTRAMUSCULAR

## 2019-03-17 ENCOUNTER — Other Ambulatory Visit: Payer: Self-pay

## 2019-03-18 ENCOUNTER — Other Ambulatory Visit: Payer: Self-pay

## 2019-03-18 ENCOUNTER — Inpatient Hospital Stay: Payer: Medicare Other

## 2019-03-18 VITALS — BP 118/42 | HR 64 | Resp 18

## 2019-03-18 DIAGNOSIS — R7989 Other specified abnormal findings of blood chemistry: Secondary | ICD-10-CM

## 2019-03-18 LAB — FERRITIN: Ferritin: 139 ng/mL (ref 24–336)

## 2019-03-18 LAB — CBC
HCT: 35 % — ABNORMAL LOW (ref 39.0–52.0)
Hemoglobin: 12.1 g/dL — ABNORMAL LOW (ref 13.0–17.0)
MCH: 36.4 pg — ABNORMAL HIGH (ref 26.0–34.0)
MCHC: 34.6 g/dL (ref 30.0–36.0)
MCV: 105.4 fL — ABNORMAL HIGH (ref 80.0–100.0)
Platelets: 168 10*3/uL (ref 150–400)
RBC: 3.32 MIL/uL — ABNORMAL LOW (ref 4.22–5.81)
RDW: 12.5 % (ref 11.5–15.5)
WBC: 3.8 10*3/uL — ABNORMAL LOW (ref 4.0–10.5)
nRBC: 0 % (ref 0.0–0.2)

## 2019-03-18 MED ORDER — CYANOCOBALAMIN 1000 MCG/ML IJ SOLN
1000.0000 ug | INTRAMUSCULAR | Status: DC
  Administered 2019-03-18: 1000 ug via INTRAMUSCULAR

## 2019-03-25 ENCOUNTER — Inpatient Hospital Stay: Payer: Medicare Other

## 2019-03-25 ENCOUNTER — Other Ambulatory Visit: Payer: Self-pay

## 2019-03-25 VITALS — BP 109/71 | HR 68 | Resp 18

## 2019-03-25 DIAGNOSIS — R7989 Other specified abnormal findings of blood chemistry: Secondary | ICD-10-CM

## 2019-03-25 LAB — CBC
HCT: 37.5 % — ABNORMAL LOW (ref 39.0–52.0)
Hemoglobin: 13.2 g/dL (ref 13.0–17.0)
MCH: 36.8 pg — ABNORMAL HIGH (ref 26.0–34.0)
MCHC: 35.2 g/dL (ref 30.0–36.0)
MCV: 104.5 fL — ABNORMAL HIGH (ref 80.0–100.0)
Platelets: 166 10*3/uL (ref 150–400)
RBC: 3.59 MIL/uL — ABNORMAL LOW (ref 4.22–5.81)
RDW: 12.3 % (ref 11.5–15.5)
WBC: 3.8 10*3/uL — ABNORMAL LOW (ref 4.0–10.5)
nRBC: 0 % (ref 0.0–0.2)

## 2019-03-25 LAB — FERRITIN: Ferritin: 160 ng/mL (ref 24–336)

## 2019-03-25 MED ORDER — CYANOCOBALAMIN 1000 MCG/ML IJ SOLN
1000.0000 ug | INTRAMUSCULAR | Status: DC
  Administered 2019-03-25: 1000 ug via INTRAMUSCULAR

## 2019-03-31 ENCOUNTER — Other Ambulatory Visit: Payer: Self-pay

## 2019-04-01 ENCOUNTER — Inpatient Hospital Stay: Payer: Medicare Other

## 2019-04-01 ENCOUNTER — Inpatient Hospital Stay: Payer: Medicare Other | Attending: Oncology

## 2019-04-01 ENCOUNTER — Other Ambulatory Visit: Payer: Self-pay

## 2019-04-01 VITALS — BP 138/80 | HR 66 | Resp 18

## 2019-04-01 DIAGNOSIS — R7989 Other specified abnormal findings of blood chemistry: Secondary | ICD-10-CM

## 2019-04-01 LAB — CBC
HCT: 37.6 % — ABNORMAL LOW (ref 39.0–52.0)
Hemoglobin: 13 g/dL (ref 13.0–17.0)
MCH: 36.3 pg — ABNORMAL HIGH (ref 26.0–34.0)
MCHC: 34.6 g/dL (ref 30.0–36.0)
MCV: 105 fL — ABNORMAL HIGH (ref 80.0–100.0)
Platelets: 151 10*3/uL (ref 150–400)
RBC: 3.58 MIL/uL — ABNORMAL LOW (ref 4.22–5.81)
RDW: 12.3 % (ref 11.5–15.5)
WBC: 4.1 10*3/uL (ref 4.0–10.5)
nRBC: 0 % (ref 0.0–0.2)

## 2019-04-01 LAB — FERRITIN: Ferritin: 113 ng/mL (ref 24–336)

## 2019-04-07 ENCOUNTER — Other Ambulatory Visit: Payer: Self-pay

## 2019-04-08 ENCOUNTER — Other Ambulatory Visit: Payer: Self-pay

## 2019-04-08 ENCOUNTER — Inpatient Hospital Stay: Payer: Medicare Other

## 2019-04-08 VITALS — BP 127/76 | HR 70 | Resp 18

## 2019-04-08 DIAGNOSIS — R7989 Other specified abnormal findings of blood chemistry: Secondary | ICD-10-CM

## 2019-04-08 LAB — CBC
HCT: 38.6 % — ABNORMAL LOW (ref 39.0–52.0)
Hemoglobin: 13.2 g/dL (ref 13.0–17.0)
MCH: 36 pg — ABNORMAL HIGH (ref 26.0–34.0)
MCHC: 34.2 g/dL (ref 30.0–36.0)
MCV: 105.2 fL — ABNORMAL HIGH (ref 80.0–100.0)
Platelets: 154 10*3/uL (ref 150–400)
RBC: 3.67 MIL/uL — ABNORMAL LOW (ref 4.22–5.81)
RDW: 12.4 % (ref 11.5–15.5)
WBC: 3.4 10*3/uL — ABNORMAL LOW (ref 4.0–10.5)
nRBC: 0 % (ref 0.0–0.2)

## 2019-04-08 LAB — FERRITIN: Ferritin: 103 ng/mL (ref 24–336)

## 2019-04-14 ENCOUNTER — Other Ambulatory Visit: Payer: Self-pay

## 2019-04-15 ENCOUNTER — Inpatient Hospital Stay: Payer: Medicare Other

## 2019-04-15 ENCOUNTER — Other Ambulatory Visit: Payer: Self-pay

## 2019-04-15 VITALS — BP 128/75 | HR 67 | Temp 98.5°F | Resp 18

## 2019-04-15 DIAGNOSIS — R7989 Other specified abnormal findings of blood chemistry: Secondary | ICD-10-CM

## 2019-04-15 LAB — CBC
HCT: 39.3 % (ref 39.0–52.0)
Hemoglobin: 13.6 g/dL (ref 13.0–17.0)
MCH: 36 pg — ABNORMAL HIGH (ref 26.0–34.0)
MCHC: 34.6 g/dL (ref 30.0–36.0)
MCV: 104 fL — ABNORMAL HIGH (ref 80.0–100.0)
Platelets: 155 10*3/uL (ref 150–400)
RBC: 3.78 MIL/uL — ABNORMAL LOW (ref 4.22–5.81)
RDW: 12.2 % (ref 11.5–15.5)
WBC: 4.1 10*3/uL (ref 4.0–10.5)
nRBC: 0 % (ref 0.0–0.2)

## 2019-04-15 LAB — FERRITIN: Ferritin: 103 ng/mL (ref 24–336)

## 2019-04-15 NOTE — Progress Notes (Signed)
Ferritin: 103 on 04/08/2019. Today's Ferritin result pending. MD, Dr. Janese Banks, notified. Per MD order: reference Ferritin result from 04/08/2019 and proceed with phlebotomy today. Order present in supportive therapy plan.

## 2019-04-22 ENCOUNTER — Inpatient Hospital Stay: Payer: Medicare Other

## 2019-04-29 ENCOUNTER — Inpatient Hospital Stay: Payer: Medicare Other

## 2019-04-29 ENCOUNTER — Inpatient Hospital Stay: Payer: Medicare Other | Attending: Oncology

## 2019-04-29 DIAGNOSIS — R7989 Other specified abnormal findings of blood chemistry: Secondary | ICD-10-CM | POA: Insufficient documentation

## 2019-04-29 DIAGNOSIS — I10 Essential (primary) hypertension: Secondary | ICD-10-CM | POA: Insufficient documentation

## 2019-04-29 DIAGNOSIS — F329 Major depressive disorder, single episode, unspecified: Secondary | ICD-10-CM | POA: Insufficient documentation

## 2019-04-29 DIAGNOSIS — D7589 Other specified diseases of blood and blood-forming organs: Secondary | ICD-10-CM | POA: Insufficient documentation

## 2019-04-29 DIAGNOSIS — F17210 Nicotine dependence, cigarettes, uncomplicated: Secondary | ICD-10-CM | POA: Insufficient documentation

## 2019-04-29 DIAGNOSIS — Z79899 Other long term (current) drug therapy: Secondary | ICD-10-CM | POA: Insufficient documentation

## 2019-04-29 DIAGNOSIS — F419 Anxiety disorder, unspecified: Secondary | ICD-10-CM | POA: Insufficient documentation

## 2019-05-06 ENCOUNTER — Inpatient Hospital Stay: Payer: Medicare Other

## 2019-05-13 ENCOUNTER — Inpatient Hospital Stay: Payer: Medicare Other

## 2019-05-13 ENCOUNTER — Other Ambulatory Visit: Payer: Self-pay

## 2019-05-13 DIAGNOSIS — F329 Major depressive disorder, single episode, unspecified: Secondary | ICD-10-CM | POA: Diagnosis not present

## 2019-05-13 DIAGNOSIS — F17210 Nicotine dependence, cigarettes, uncomplicated: Secondary | ICD-10-CM | POA: Diagnosis not present

## 2019-05-13 DIAGNOSIS — F419 Anxiety disorder, unspecified: Secondary | ICD-10-CM | POA: Diagnosis not present

## 2019-05-13 DIAGNOSIS — I10 Essential (primary) hypertension: Secondary | ICD-10-CM | POA: Diagnosis not present

## 2019-05-13 DIAGNOSIS — R7989 Other specified abnormal findings of blood chemistry: Secondary | ICD-10-CM | POA: Diagnosis not present

## 2019-05-13 DIAGNOSIS — Z79899 Other long term (current) drug therapy: Secondary | ICD-10-CM | POA: Diagnosis not present

## 2019-05-13 DIAGNOSIS — D7589 Other specified diseases of blood and blood-forming organs: Secondary | ICD-10-CM | POA: Diagnosis not present

## 2019-05-13 LAB — CBC
HCT: 44.1 % (ref 39.0–52.0)
Hemoglobin: 14.7 g/dL (ref 13.0–17.0)
MCH: 35.2 pg — ABNORMAL HIGH (ref 26.0–34.0)
MCHC: 33.3 g/dL (ref 30.0–36.0)
MCV: 105.5 fL — ABNORMAL HIGH (ref 80.0–100.0)
Platelets: 152 10*3/uL (ref 150–400)
RBC: 4.18 MIL/uL — ABNORMAL LOW (ref 4.22–5.81)
RDW: 12.2 % (ref 11.5–15.5)
WBC: 4.8 10*3/uL (ref 4.0–10.5)
nRBC: 0 % (ref 0.0–0.2)

## 2019-05-13 LAB — FERRITIN: Ferritin: 89 ng/mL (ref 24–336)

## 2019-05-23 ENCOUNTER — Other Ambulatory Visit: Payer: Self-pay

## 2019-05-23 NOTE — Progress Notes (Signed)
Patient pre screened for office appointment, no questions or concerns today. Patient reminded of upcoming appointment time and date. 

## 2019-05-24 ENCOUNTER — Inpatient Hospital Stay (HOSPITAL_BASED_OUTPATIENT_CLINIC_OR_DEPARTMENT_OTHER): Payer: Medicare Other | Admitting: Oncology

## 2019-05-24 ENCOUNTER — Inpatient Hospital Stay: Payer: Medicare Other

## 2019-05-24 ENCOUNTER — Other Ambulatory Visit: Payer: Self-pay

## 2019-05-24 VITALS — BP 154/83 | HR 81 | Temp 98.1°F | Ht 68.0 in | Wt 167.0 lb

## 2019-05-24 VITALS — BP 131/84 | HR 76

## 2019-05-24 DIAGNOSIS — G4700 Insomnia, unspecified: Secondary | ICD-10-CM | POA: Insufficient documentation

## 2019-05-24 DIAGNOSIS — F17200 Nicotine dependence, unspecified, uncomplicated: Secondary | ICD-10-CM | POA: Insufficient documentation

## 2019-05-24 DIAGNOSIS — R7989 Other specified abnormal findings of blood chemistry: Secondary | ICD-10-CM

## 2019-05-24 DIAGNOSIS — F419 Anxiety disorder, unspecified: Secondary | ICD-10-CM | POA: Insufficient documentation

## 2019-05-24 DIAGNOSIS — F329 Major depressive disorder, single episode, unspecified: Secondary | ICD-10-CM | POA: Insufficient documentation

## 2019-05-24 DIAGNOSIS — N529 Male erectile dysfunction, unspecified: Secondary | ICD-10-CM | POA: Insufficient documentation

## 2019-05-24 DIAGNOSIS — G8929 Other chronic pain: Secondary | ICD-10-CM | POA: Insufficient documentation

## 2019-05-24 DIAGNOSIS — M549 Dorsalgia, unspecified: Secondary | ICD-10-CM | POA: Insufficient documentation

## 2019-05-24 DIAGNOSIS — F32A Depression, unspecified: Secondary | ICD-10-CM | POA: Insufficient documentation

## 2019-05-24 DIAGNOSIS — D7589 Other specified diseases of blood and blood-forming organs: Secondary | ICD-10-CM

## 2019-05-24 DIAGNOSIS — I10 Essential (primary) hypertension: Secondary | ICD-10-CM | POA: Insufficient documentation

## 2019-05-24 LAB — CBC
HCT: 44 % (ref 39.0–52.0)
Hemoglobin: 14.8 g/dL (ref 13.0–17.0)
MCH: 35.5 pg — ABNORMAL HIGH (ref 26.0–34.0)
MCHC: 33.6 g/dL (ref 30.0–36.0)
MCV: 105.5 fL — ABNORMAL HIGH (ref 80.0–100.0)
Platelets: 162 10*3/uL (ref 150–400)
RBC: 4.17 MIL/uL — ABNORMAL LOW (ref 4.22–5.81)
RDW: 12.8 % (ref 11.5–15.5)
WBC: 4.9 10*3/uL (ref 4.0–10.5)
nRBC: 0 % (ref 0.0–0.2)

## 2019-05-24 LAB — COMPREHENSIVE METABOLIC PANEL
ALT: 21 U/L (ref 0–44)
AST: 25 U/L (ref 15–41)
Albumin: 4.3 g/dL (ref 3.5–5.0)
Alkaline Phosphatase: 62 U/L (ref 38–126)
Anion gap: 8 (ref 5–15)
BUN: 12 mg/dL (ref 8–23)
CO2: 25 mmol/L (ref 22–32)
Calcium: 9.6 mg/dL (ref 8.9–10.3)
Chloride: 101 mmol/L (ref 98–111)
Creatinine, Ser: 0.95 mg/dL (ref 0.61–1.24)
GFR calc Af Amer: >60 mL/min (ref >60)
GFR calc non Af Amer: >60 mL/min (ref >60)
Glucose, Bld: 86 mg/dL (ref 70–99)
Potassium: 4.6 mmol/L (ref 3.5–5.1)
Sodium: 134 mmol/L — ABNORMAL LOW (ref 135–145)
Total Bilirubin: 1.1 mg/dL (ref 0.3–1.2)
Total Protein: 7.9 g/dL (ref 6.5–8.1)

## 2019-05-24 LAB — FERRITIN: Ferritin: 61 ng/mL (ref 24–336)

## 2019-05-24 NOTE — Progress Notes (Signed)
Patient stated that he had been doing well with no complaints. 

## 2019-05-29 NOTE — Progress Notes (Signed)
Hematology/Oncology Consult note [facility]  Telephone:([phone] [phone]  Patient Care Team: Idelle Crouch, MD as PCP - General (Internal Medicine)   Name of the patient: Shawn Gonzalez  [id number]  [birth year]   Date of visit: 05/29/19  Diagnosis- porphyria cutanea tarda   Chief complaint/ Reason for visit-routine follow-up of porphyria cutanea tarda for possible phlebotomy  Heme/Onc history:  Patient is a 64 year old male with a past medical history significant for hypertension, anxiety, chronic back pain and tobacco dependence. He has had on and off blistering of his bilateral hands and has seen dermatology in the past and was attributed to hematological disorder. Patient states that he has came to the cancer center many years ago and underwent phlebotomy at that time. He is unsure if he has hemochromatosis. Family history of any liver disease. He does report drinking alcohol sometimes he may have a few beers and 1 go sometimes he may go for days without drink. He also smokes 1 pack of cigarettes per day for the last 42 years.  Results of blood work from 12/10/2018 showed elevated ferritinof 772.HIV and hepatitis C testing was negative. HFE gene testing was negative.   Ref Range & Units 4wk ago  Uroporphyrins (UP) 0 - 20 ug/L 380High   Heptacarboxyl (7-CP) 0 - 2 ug/L 395High   Hexacarboxyl (6-CP) 0 - 1 ug/L <1   Pentacarboxyl (5-CP) 0 - 2 ug/L 38High   Coproporphyrin (CP) I 0 - 15 ug/L 64High   Coproporphyrin (CP) III 0 - 49 ug/L 58High    Overall results consistent with porphyria cutanea tarda and patient was started on phlebotomy to keep initial ferritin less than 25.    Interval history- he continues to drink alcohol once or twice a week. Skin blisters are coming down. Has ongoing fatigue  ECOG PS- 1 Pain scale- 0   Review of systems- Review of Systems  Constitutional: Positive for malaise/fatigue.  Negative for chills, fever and weight loss.  HENT: Negative for congestion, ear discharge and nosebleeds.   Eyes: Negative for blurred vision.  Respiratory: Negative for cough, hemoptysis, sputum production, shortness of breath and wheezing.   Cardiovascular: Negative for chest pain, palpitations, orthopnea and claudication.  Gastrointestinal: Negative for abdominal pain, blood in stool, constipation, diarrhea, heartburn, melena, nausea and vomiting.  Genitourinary: Negative for dysuria, flank pain, frequency, hematuria and urgency.  Musculoskeletal: Negative for back pain, joint pain and myalgias.  Skin: Negative for rash.  Neurological: Negative for dizziness, tingling, focal weakness, seizures, weakness and headaches.  Endo/Heme/Allergies: Does not bruise/bleed easily.  Psychiatric/Behavioral: Negative for depression and suicidal ideas. The patient does not have insomnia.       Allergies  Allergen Reactions  . Celebrex [Celecoxib] Nausea Only     Past Medical History:  Diagnosis Date  . Anxiety   . Chronic low back pain   . Depression   . Elevated ferritin   . Erectile dysfunction   . History of chicken pox   . Hypertension   . Insomnia      Past Surgical History:  Procedure Laterality Date  . COLONOSCOPY WITH PROPOFOL N/A 10/31/2016   Procedure: COLONOSCOPY WITH PROPOFOL;  Surgeon: Manya Silvas, MD;  Location: [facility] ENDOSCOPY;  Service: Endoscopy;  Laterality: N/A;  . FINGER SURGERY Right    RT.INDEX  . MULTIPLE TOOTH EXTRACTIONS      Social History   Socioeconomic History  . Marital status: Married    Spouse name: Not on  file  . Number of children: Not on file  . Years of education: Not on file  . Highest education level: Not on file  Occupational History  . Not on file  Tobacco Use  . Smoking status: Current Every Day Smoker    Packs/day: 1.00    Years: 42.00    Pack years: 42.00    Types: Cigarettes  . Smokeless tobacco: Never Used  Substance and  Sexual Activity  . Alcohol use: Yes  . Drug use: No  . Sexual activity: Not on file  Other Topics Concern  . Not on file  Social History Narrative  . Not on file   Social Determinants of Health   Financial Resource Strain:   . Difficulty of Paying Living Expenses: Not on file  Food Insecurity:   . Worried About Charity fundraiser in the Last Year: Not on file  . Ran Out of Food in the Last Year: Not on file  Transportation Needs:   . Lack of Transportation (Medical): Not on file  . Lack of Transportation (Non-Medical): Not on file  Physical Activity:   . Days of Exercise per Week: Not on file  . Minutes of Exercise per Session: Not on file  Stress:   . Feeling of Stress : Not on file  Social Connections:   . Frequency of Communication with Friends and Family: Not on file  . Frequency of Social Gatherings with Friends and Family: Not on file  . Attends Religious Services: Not on file  . Active Member of Clubs or Organizations: Not on file  . Attends Archivist Meetings: Not on file  . Marital Status: Not on file  Intimate Partner Violence:   . Fear of Current or Ex-Partner: Not on file  . Emotionally Abused: Not on file  . Physically Abused: Not on file  . Sexually Abused: Not on file    No family history on file.   Current Outpatient Medications:  .  ALPRAZolam (XANAX) 0.25 MG tablet, Take 0.25 mg by mouth 2 (two) times daily as needed for anxiety or sleep., Disp: , Rfl:  .  amLODipine (NORVASC) 10 MG tablet, Take 10 mg by mouth daily., Disp: , Rfl:  .  bisoprolol-hydrochlorothiazide (ZIAC) 5-6.25 MG tablet, Take 1 tablet by mouth daily., Disp: , Rfl:  .  cyclobenzaprine (FLEXERIL) 10 MG tablet, Take 10 mg by mouth 3 (three) times daily as needed for muscle spasms., Disp: , Rfl:  .  HYDROcodone-acetaminophen (NORCO/VICODIN) 5-325 MG tablet, Take 1 tablet by mouth every 6 (six) hours as needed for moderate pain., Disp: , Rfl:  .  lisinopril (PRINIVIL,ZESTRIL)  40 MG tablet, Take 40 mg by mouth daily., Disp: , Rfl:  .  sildenafil (VIAGRA) 50 MG tablet, Take 50 mg by mouth daily as needed. Take 1/2 tab po qd prn, Disp: , Rfl:   Physical exam:  Vitals:   05/24/19 1400  BP: (!) 154/83  Pulse: 81  Temp: 98.1 F (36.7 C)  TempSrc: Tympanic  Weight: 167 lb (75.8 kg)  Height: 5\' 8"  (1.727 m)   Physical Exam Constitutional:      Comments: Face appears flushed. Mild resting tremors  HENT:     Head: Normocephalic and atraumatic.  Eyes:     Pupils: Pupils are equal, round, and reactive to light.  Cardiovascular:     Rate and Rhythm: Normal rate and regular rhythm.     Heart sounds: Normal heart sounds.  Pulmonary:  Effort: Pulmonary effort is normal.     Breath sounds: Normal breath sounds.  Abdominal:     General: Bowel sounds are normal.     Palpations: Abdomen is soft.  Musculoskeletal:     Cervical back: Normal range of motion.  Skin:    General: Skin is warm and dry.     Comments: Rough chapped skin over fingers. bandaid over couple of fingers. Few scattered healed skin lesions  Neurological:     Mental Status: He is alert and oriented to person, place, and time.      CMP Latest Ref Rng & Units 05/24/2019  Glucose 70 - 99 mg/dL 86  BUN 8 - 23 mg/dL 12  Creatinine 0.61 - 1.24 mg/dL 0.95  Sodium 135 - 145 mmol/L 134(L)  Potassium 3.5 - 5.1 mmol/L 4.6  Chloride 98 - 111 mmol/L 101  CO2 22 - 32 mmol/L 25  Calcium 8.9 - 10.3 mg/dL 9.6  Total Protein 6.5 - 8.1 g/dL 7.9  Total Bilirubin 0.3 - 1.2 mg/dL 1.1  Alkaline Phos 38 - 126 U/L 62  AST 15 - 41 U/L 25  ALT 0 - 44 U/L 21   CBC Latest Ref Rng & Units 05/24/2019  WBC 4.0 - 10.5 K/uL 4.9  Hemoglobin 13.0 - 17.0 g/dL 14.8  Hematocrit 39.0 - 52.0 % 44.0  Platelets 150 - 400 K/uL 162    No images are attached to the encounter.  No results found.   Assessment and plan- Patient is a 64 y.o. male with porphyria cutanea tarda here for routine f/u for possible  phlebotomy  I have reviewed labs done today.  Goal ferritin is less than 25. Levels today are 61. Hb is stable around 14. Continue weekly phlebotomy until ferritin <25.  Macrocytosis likely due to alcohol. Continue to monitor. Will obtain US liver to assess for cirrhosis over next 2-3 weeks. Labs not suggestive of cirrhosis  I will see him back in 3 months with labs   Visit Diagnosis 1. Elevated ferritin level   2. Porphyria cutanea tarda ([place])      Dr. Randa Evens, MD, MPH [facility] [id number] 05/29/2019 8:52 AM

## 2019-05-31 ENCOUNTER — Inpatient Hospital Stay: Payer: Medicare Other | Attending: Oncology

## 2019-05-31 ENCOUNTER — Other Ambulatory Visit: Payer: Self-pay

## 2019-05-31 ENCOUNTER — Inpatient Hospital Stay: Payer: Medicare Other

## 2019-05-31 VITALS — BP 123/80 | HR 67 | Temp 97.8°F | Resp 18

## 2019-05-31 DIAGNOSIS — F17210 Nicotine dependence, cigarettes, uncomplicated: Secondary | ICD-10-CM | POA: Insufficient documentation

## 2019-05-31 DIAGNOSIS — D7589 Other specified diseases of blood and blood-forming organs: Secondary | ICD-10-CM | POA: Insufficient documentation

## 2019-05-31 DIAGNOSIS — F329 Major depressive disorder, single episode, unspecified: Secondary | ICD-10-CM | POA: Diagnosis not present

## 2019-05-31 DIAGNOSIS — I10 Essential (primary) hypertension: Secondary | ICD-10-CM | POA: Insufficient documentation

## 2019-05-31 DIAGNOSIS — Z79899 Other long term (current) drug therapy: Secondary | ICD-10-CM | POA: Insufficient documentation

## 2019-05-31 DIAGNOSIS — F419 Anxiety disorder, unspecified: Secondary | ICD-10-CM | POA: Insufficient documentation

## 2019-05-31 DIAGNOSIS — R7989 Other specified abnormal findings of blood chemistry: Secondary | ICD-10-CM | POA: Diagnosis not present

## 2019-05-31 LAB — CBC
HCT: 43.1 % (ref 39.0–52.0)
Hemoglobin: 14.5 g/dL (ref 13.0–17.0)
MCH: 35 pg — ABNORMAL HIGH (ref 26.0–34.0)
MCHC: 33.6 g/dL (ref 30.0–36.0)
MCV: 104.1 fL — ABNORMAL HIGH (ref 80.0–100.0)
Platelets: 154 10*3/uL (ref 150–400)
RBC: 4.14 MIL/uL — ABNORMAL LOW (ref 4.22–5.81)
RDW: 12.9 % (ref 11.5–15.5)
WBC: 4.1 10*3/uL (ref 4.0–10.5)
nRBC: 0 % (ref 0.0–0.2)

## 2019-05-31 LAB — PROTIME-INR
INR: 1.1 (ref 0.8–1.2)
Prothrombin Time: 14.1 seconds (ref 11.4–15.2)

## 2019-05-31 LAB — FERRITIN: Ferritin: 54 ng/mL (ref 24–336)

## 2019-05-31 NOTE — Progress Notes (Signed)
1440: Per Dr. Janese Banks okay to proceed with Phlebotomy prior to labs resulting.   Therapeutic phlebotomy performed per MD order, removing a total of 250cc using a 20 gauge PIV in left AC. Drink provided, pt declines a snack. Pt tolerated procedure well. Pt and VS stable at discharge.

## 2019-06-02 ENCOUNTER — Other Ambulatory Visit: Payer: Self-pay | Admitting: Oncology

## 2019-06-02 DIAGNOSIS — R7989 Other specified abnormal findings of blood chemistry: Secondary | ICD-10-CM

## 2019-06-02 DIAGNOSIS — R748 Abnormal levels of other serum enzymes: Secondary | ICD-10-CM

## 2019-06-06 ENCOUNTER — Other Ambulatory Visit: Payer: Self-pay

## 2019-06-07 ENCOUNTER — Other Ambulatory Visit: Payer: Self-pay

## 2019-06-07 ENCOUNTER — Inpatient Hospital Stay: Payer: Medicare Other

## 2019-06-07 VITALS — BP 109/73 | HR 58 | Temp 98.2°F

## 2019-06-07 DIAGNOSIS — R7989 Other specified abnormal findings of blood chemistry: Secondary | ICD-10-CM

## 2019-06-07 LAB — CBC
HCT: 38.8 % — ABNORMAL LOW (ref 39.0–52.0)
Hemoglobin: 13.1 g/dL (ref 13.0–17.0)
MCH: 35.2 pg — ABNORMAL HIGH (ref 26.0–34.0)
MCHC: 33.8 g/dL (ref 30.0–36.0)
MCV: 104.3 fL — ABNORMAL HIGH (ref 80.0–100.0)
Platelets: 140 10*3/uL — ABNORMAL LOW (ref 150–400)
RBC: 3.72 MIL/uL — ABNORMAL LOW (ref 4.22–5.81)
RDW: 12.6 % (ref 11.5–15.5)
WBC: 3.5 10*3/uL — ABNORMAL LOW (ref 4.0–10.5)
nRBC: 0 % (ref 0.0–0.2)

## 2019-06-07 LAB — FERRITIN: Ferritin: 63 ng/mL (ref 24–336)

## 2019-06-14 ENCOUNTER — Inpatient Hospital Stay: Payer: Medicare Other

## 2019-06-14 ENCOUNTER — Other Ambulatory Visit: Payer: Self-pay

## 2019-06-14 VITALS — BP 156/79 | HR 68 | Temp 98.8°F | Resp 18

## 2019-06-14 DIAGNOSIS — R7989 Other specified abnormal findings of blood chemistry: Secondary | ICD-10-CM

## 2019-06-14 LAB — CBC
HCT: 39.1 % (ref 39.0–52.0)
Hemoglobin: 13.1 g/dL (ref 13.0–17.0)
MCH: 35.2 pg — ABNORMAL HIGH (ref 26.0–34.0)
MCHC: 33.5 g/dL (ref 30.0–36.0)
MCV: 105.1 fL — ABNORMAL HIGH (ref 80.0–100.0)
Platelets: 154 10*3/uL (ref 150–400)
RBC: 3.72 MIL/uL — ABNORMAL LOW (ref 4.22–5.81)
RDW: 12.9 % (ref 11.5–15.5)
WBC: 4.6 10*3/uL (ref 4.0–10.5)
nRBC: 0 % (ref 0.0–0.2)

## 2019-06-14 LAB — FERRITIN: Ferritin: 52 ng/mL (ref 24–336)

## 2019-06-14 NOTE — Progress Notes (Signed)
Per Dr. Janese Banks proceed with scheduled phlebotomy with 06/07/19 Ferritin level of 63.  Therapeutic phlebotomy performed per MD order using 20 gauge PIV in rt AC removing 250 cc per order. Drink provided, pt declined snack. Pt tolerated procedure well. Pt and VS stable at discharge.

## 2019-06-16 ENCOUNTER — Other Ambulatory Visit: Payer: Self-pay

## 2019-06-16 ENCOUNTER — Ambulatory Visit
Admission: RE | Admit: 2019-06-16 | Discharge: 2019-06-16 | Disposition: A | Discharge status: Home / Self Care | Payer: Medicare Other | Source: Ambulatory Visit | Attending: Oncology | Admitting: Oncology

## 2019-06-16 DIAGNOSIS — R7989 Other specified abnormal findings of blood chemistry: Secondary | ICD-10-CM | POA: Diagnosis present

## 2019-06-16 DIAGNOSIS — R748 Abnormal levels of other serum enzymes: Secondary | ICD-10-CM | POA: Diagnosis present

## 2019-06-21 ENCOUNTER — Inpatient Hospital Stay: Payer: Medicare Other

## 2019-06-21 ENCOUNTER — Other Ambulatory Visit: Payer: Self-pay

## 2019-06-21 DIAGNOSIS — R7989 Other specified abnormal findings of blood chemistry: Secondary | ICD-10-CM

## 2019-06-21 LAB — CBC
HCT: 40.5 % (ref 39.0–52.0)
Hemoglobin: 13.3 g/dL (ref 13.0–17.0)
MCH: 34.5 pg — ABNORMAL HIGH (ref 26.0–34.0)
MCHC: 32.8 g/dL (ref 30.0–36.0)
MCV: 105.2 fL — ABNORMAL HIGH (ref 80.0–100.0)
Platelets: 167 10*3/uL (ref 150–400)
RBC: 3.85 MIL/uL — ABNORMAL LOW (ref 4.22–5.81)
RDW: 12.6 % (ref 11.5–15.5)
WBC: 4.4 10*3/uL (ref 4.0–10.5)
nRBC: 0 % (ref 0.0–0.2)

## 2019-06-21 LAB — FERRITIN: Ferritin: 44 ng/mL (ref 24–336)

## 2019-06-27 ENCOUNTER — Telehealth: Payer: Self-pay | Admitting: Oncology

## 2019-06-27 NOTE — Telephone Encounter (Signed)
Called patient to prescreen for lab and phlebotomy appointment.  Patient states he has been waiting on a call as he will be unable to come on 2/2 but will keep appointment on 2/9 at 215.  [place]

## 2019-06-28 ENCOUNTER — Inpatient Hospital Stay: Payer: Medicare Other

## 2019-07-04 ENCOUNTER — Telehealth: Payer: Self-pay | Admitting: Oncology

## 2019-07-04 NOTE — Telephone Encounter (Signed)
Patient was phoned for 07-05-19 appt reminder. Patient stated that he was having work done at his house and had missed the last appt and would have to miss this one as well. He stated that he would be at his next appt. MD made aware.

## 2019-07-05 ENCOUNTER — Inpatient Hospital Stay: Payer: Medicare Other

## 2019-07-11 ENCOUNTER — Other Ambulatory Visit: Payer: Self-pay

## 2019-07-12 ENCOUNTER — Other Ambulatory Visit: Payer: Self-pay

## 2019-07-12 ENCOUNTER — Inpatient Hospital Stay: Payer: Medicare Other | Attending: Oncology

## 2019-07-12 ENCOUNTER — Inpatient Hospital Stay: Payer: Medicare Other

## 2019-07-12 ENCOUNTER — Other Ambulatory Visit: Payer: Self-pay | Admitting: Oncology

## 2019-07-12 VITALS — BP 132/80 | HR 65 | Temp 97.7°F | Resp 18

## 2019-07-12 DIAGNOSIS — E538 Deficiency of other specified B group vitamins: Secondary | ICD-10-CM | POA: Insufficient documentation

## 2019-07-12 DIAGNOSIS — R7989 Other specified abnormal findings of blood chemistry: Secondary | ICD-10-CM

## 2019-07-12 LAB — FERRITIN: Ferritin: 33 ng/mL (ref 24–336)

## 2019-07-12 LAB — CBC
HCT: 38.8 % — ABNORMAL LOW (ref 39.0–52.0)
Hemoglobin: 12.9 g/dL — ABNORMAL LOW (ref 13.0–17.0)
MCH: 34.1 pg — ABNORMAL HIGH (ref 26.0–34.0)
MCHC: 33.2 g/dL (ref 30.0–36.0)
MCV: 102.6 fL — ABNORMAL HIGH (ref 80.0–100.0)
Platelets: 141 10*3/uL — ABNORMAL LOW (ref 150–400)
RBC: 3.78 MIL/uL — ABNORMAL LOW (ref 4.22–5.81)
RDW: 12.7 % (ref 11.5–15.5)
WBC: 3.6 10*3/uL — ABNORMAL LOW (ref 4.0–10.5)
nRBC: 0 % (ref 0.0–0.2)

## 2019-07-12 MED ORDER — CYANOCOBALAMIN 1000 MCG/ML IJ SOLN
1000.0000 ug | INTRAMUSCULAR | Status: DC
  Administered 2019-07-12: 1000 ug via INTRAMUSCULAR

## 2019-07-12 NOTE — Progress Notes (Signed)
Hemoglobin: 12.9 today. Ferritin result pending. Ferritin: 44 on 06/21/2019. MD, Dr. Janese Banks, notified. Per MD order: reference Ferritin result from 06/21/2019 and proceed with phlebotomy today. Order present in supportive therapy plan.  Per MD, Dr. Janese Banks, order: release order from supportive therapy plan for Cyanocobalamin (Vitamin B-12) injection 1,000 mcg Intramuscular and administer today.

## 2019-07-19 ENCOUNTER — Telehealth: Payer: Self-pay

## 2019-07-19 ENCOUNTER — Inpatient Hospital Stay: Payer: Medicare Other

## 2019-07-19 NOTE — Telephone Encounter (Signed)
ok 

## 2019-07-19 NOTE — Telephone Encounter (Signed)
Patient called stating that he wanted to cancel today's appointment and schedule an appointment to see Dr. Janese Banks to discuss his treatment plan since he wanted to stop doing the scheduled phlebotomy's. Patient also stated that his wife told him that his lab results were good and that he did not needed to come in. Liana Crocker was informed and she cancelled today's phlebotomy and scheduled an appointment with Dr. Janese Banks next Tuesday.  Is this okay with you Dr. Janese Banks?

## 2019-07-25 ENCOUNTER — Other Ambulatory Visit: Payer: Self-pay

## 2019-07-25 ENCOUNTER — Encounter: Payer: Self-pay | Admitting: Oncology

## 2019-07-25 NOTE — Progress Notes (Signed)
Patient stated that he had been doing well but he has chronic pain and that with this weather, it makes the pain worse.

## 2019-07-26 ENCOUNTER — Other Ambulatory Visit: Payer: Self-pay

## 2019-07-26 ENCOUNTER — Inpatient Hospital Stay: Payer: Medicare Other

## 2019-07-26 ENCOUNTER — Inpatient Hospital Stay: Payer: Medicare Other | Attending: Oncology | Admitting: Oncology

## 2019-07-26 DIAGNOSIS — Z79899 Other long term (current) drug therapy: Secondary | ICD-10-CM | POA: Insufficient documentation

## 2019-07-26 DIAGNOSIS — D539 Nutritional anemia, unspecified: Secondary | ICD-10-CM | POA: Insufficient documentation

## 2019-07-26 DIAGNOSIS — R7989 Other specified abnormal findings of blood chemistry: Secondary | ICD-10-CM | POA: Diagnosis not present

## 2019-07-26 DIAGNOSIS — E538 Deficiency of other specified B group vitamins: Secondary | ICD-10-CM | POA: Diagnosis not present

## 2019-07-26 DIAGNOSIS — D72819 Decreased white blood cell count, unspecified: Secondary | ICD-10-CM | POA: Insufficient documentation

## 2019-07-26 DIAGNOSIS — F17210 Nicotine dependence, cigarettes, uncomplicated: Secondary | ICD-10-CM | POA: Insufficient documentation

## 2019-07-28 NOTE — Progress Notes (Signed)
Hematology/Oncology Consult note [facility]  [phone] [phone]  Patient Care Team: Idelle Crouch, MD as PCP - General (Internal Medicine)   Name of the patient: Shawn Gonzalez  [id number]  01/08/1956   Date of visit: 07/28/19  Diagnosis- porphyria cutanea tarda  Chief complaint/ Reason for visit-routine follow-up of porphyria cutanea tarda for possible phlebotomy  Heme/Onc history: Patient is a 64 year old male with a past medical history significant for hypertension, anxiety, chronic back pain and tobacco dependence. He has had on and off blistering of his bilateral hands and has seen dermatology in the past and was attributed to hematological disorder. Patient states that he has came to the cancer center many years ago and underwent phlebotomy at that time. He is unsure if he has hemochromatosis. Family history of any liver disease. He does report drinking alcohol sometimes he may have a few beers and 1 go sometimes he may go for days without drink. He also smokes 1 pack of cigarettes per day for the last 42 years.  Results of blood work from 12/10/2018 showed elevated ferritinof 772.HIV and hepatitis B and C testing was negative. HFE gene testing was negative.   Ref Range & Units 4wk ago  Uroporphyrins (UP) 0 - 20 ug/L 380High   Heptacarboxyl (7-CP) 0 - 2 ug/L 395High   Hexacarboxyl (6-CP) 0 - 1 ug/L <1   Pentacarboxyl (5-CP) 0 - 2 ug/L 38High   Coproporphyrin (CP) I 0 - 15 ug/L 64High   Coproporphyrin (CP) III 0 - 49 ug/L 58High    Overall results consistent with porphyria cutanea tarda and patient was started on phlebotomy to keep initial ferritin less than 25.    Interval history-patient overall feels much better and has not had any skin blistering episodes.  He reports cutting down on his alcohol intake significantly but has not stop it completely yet.  ECOG PS- 1 Pain scale- 0   Review of  systems- Review of Systems  Constitutional: Positive for malaise/fatigue. Negative for chills, fever and weight loss.  HENT: Negative for congestion, ear discharge and nosebleeds.   Eyes: Negative for blurred vision.  Respiratory: Negative for cough, hemoptysis, sputum production, shortness of breath and wheezing.   Cardiovascular: Negative for chest pain, palpitations, orthopnea and claudication.  Gastrointestinal: Negative for abdominal pain, blood in stool, constipation, diarrhea, heartburn, melena, nausea and vomiting.  Genitourinary: Negative for dysuria, flank pain, frequency, hematuria and urgency.  Musculoskeletal: Negative for back pain, joint pain and myalgias.  Skin: Negative for rash.  Neurological: Negative for dizziness, tingling, focal weakness, seizures, weakness and headaches.  Endo/Heme/Allergies: Does not bruise/bleed easily.  Psychiatric/Behavioral: Negative for depression and suicidal ideas. The patient does not have insomnia.       Allergies  Allergen Reactions  . Celebrex [Celecoxib] Nausea Only     Past Medical History:  Diagnosis Date  . Anxiety   . Chronic low back pain   . Depression   . Elevated ferritin   . Erectile dysfunction   . History of chicken pox   . Hypertension   . Insomnia      Past Surgical History:  Procedure Laterality Date  . COLONOSCOPY WITH PROPOFOL N/A 10/31/2016   Procedure: COLONOSCOPY WITH PROPOFOL;  Surgeon: Manya Silvas, MD;  Location: [facility] ENDOSCOPY;  Service: Endoscopy;  Laterality: N/A;  . FINGER SURGERY Right    RT.INDEX  . MULTIPLE TOOTH EXTRACTIONS      Social History   Socioeconomic History  .  Marital status: Married    Spouse name: Not on file  . Number of children: Not on file  . Years of education: Not on file  . Highest education level: Not on file  Occupational History  . Not on file  Tobacco Use  . Smoking status: Current Every Day Smoker    Packs/day: 1.00    Years: 42.00    Pack years:  42.00    Types: Cigarettes  . Smokeless tobacco: Never Used  Substance and Sexual Activity  . Alcohol use: Yes  . Drug use: No  . Sexual activity: Not on file  Other Topics Concern  . Not on file  Social History Narrative  . Not on file   Social Determinants of Health   Financial Resource Strain:   . Difficulty of Paying Living Expenses: Not on file  Food Insecurity:   . Worried About Charity fundraiser in the Last Year: Not on file  . Ran Out of Food in the Last Year: Not on file  Transportation Needs:   . Lack of Transportation (Medical): Not on file  . Lack of Transportation (Non-Medical): Not on file  Physical Activity:   . Days of Exercise per Week: Not on file  . Minutes of Exercise per Session: Not on file  Stress:   . Feeling of Stress : Not on file  Social Connections:   . Frequency of Communication with Friends and Family: Not on file  . Frequency of Social Gatherings with Friends and Family: Not on file  . Attends Religious Services: Not on file  . Active Member of Clubs or Organizations: Not on file  . Attends Archivist Meetings: Not on file  . Marital Status: Not on file  Intimate Partner Violence:   . Fear of Current or Ex-Partner: Not on file  . Emotionally Abused: Not on file  . Physically Abused: Not on file  . Sexually Abused: Not on file    History reviewed. No pertinent family history.   Current Outpatient Medications:  .  amLODipine (NORVASC) 10 MG tablet, Take 10 mg by mouth daily., Disp: , Rfl:  .  bisoprolol-hydrochlorothiazide (ZIAC) 5-6.25 MG tablet, Take 1 tablet by mouth daily., Disp: , Rfl:  .  chlorhexidine (PERIDEX) 0.12 % solution, 1 mL by Mouth Rinse route 1 day or 1 dose., Disp: , Rfl:  .  cyclobenzaprine (FLEXERIL) 10 MG tablet, Take 10 mg by mouth 3 (three) times daily as needed for muscle spasms., Disp: , Rfl:  .  lisinopril (PRINIVIL,ZESTRIL) 40 MG tablet, Take 40 mg by mouth daily., Disp: , Rfl:  .  sildenafil  (VIAGRA) 50 MG tablet, Take 50 mg by mouth daily as needed. Take 1/2 tab po qd prn, Disp: , Rfl:  .  tiZANidine (ZANAFLEX) 4 MG tablet, Take 4 mg by mouth 3 (three) times daily as needed., Disp: , Rfl:  .  ALPRAZolam (XANAX) 0.25 MG tablet, Take 0.25 mg by mouth 2 (two) times daily as needed for anxiety or sleep., Disp: , Rfl:  .  HYDROcodone-acetaminophen (NORCO/VICODIN) 5-325 MG tablet, Take 1 tablet by mouth every 6 (six) hours as needed for moderate pain., Disp: , Rfl:   Physical exam:  Vitals:   07/26/19 1357  BP: (!) 147/94  Pulse: 71  Temp: (!) 97.3 F (36.3 C)  TempSrc: Tympanic  Weight: 164 lb 9.6 oz (74.7 kg)   Physical Exam Constitutional:      General: He is not in acute distress.  Comments: Patient has a generalized reddish/plethroic appearance which is chronic  HENT:     Head: Normocephalic and atraumatic.  Cardiovascular:     Heart sounds: Normal heart sounds.  Pulmonary:     Effort: Pulmonary effort is normal.  Skin:    General: Skin is warm and dry.  Neurological:     Mental Status: He is alert and oriented to person, place, and time.      CMP Latest Ref Rng & Units 05/24/2019  Glucose 70 - 99 mg/dL 86  BUN 8 - 23 mg/dL 12  Creatinine 0.61 - 1.24 mg/dL 0.95  Sodium 135 - 145 mmol/L 134(L)  Potassium 3.5 - 5.1 mmol/L 4.6  Chloride 98 - 111 mmol/L 101  CO2 22 - 32 mmol/L 25  Calcium 8.9 - 10.3 mg/dL 9.6  Total Protein 6.5 - 8.1 g/dL 7.9  Total Bilirubin 0.3 - 1.2 mg/dL 1.1  Alkaline Phos 38 - 126 U/L 62  AST 15 - 41 U/L 25  ALT 0 - 44 U/L 21   CBC Latest Ref Rng & Units 07/12/2019  WBC 4.0 - 10.5 K/uL 3.6(L)  Hemoglobin 13.0 - 17.0 g/dL 12.9(L)  Hematocrit 39.0 - 52.0 % 38.8(L)  Platelets 150 - 400 K/uL 141(L)      Assessment and plan- Patient is a 64 y.o. male with history of porphyria cutanea tarda here for routine follow-up for possible phlebotomy  Patient has done overall well with phlebotomy.  Ideally the goal ferritin is less than  25.However patient is getting overwhelmed with coming for phlebotomies every 2 weeks and would like to stop at this time if possible.  His most recent ferritin levels were 33.  I will hold off on doing phlebotomies at this time and repeat his CBC CMP and ferritin in 2 months in 4 months and see him back in 4 months.  Threshold to initiate phlebotomy as if ferritin is greater than 100 to keep it between 50-100 patient verbalized understanding.  I have again emphasized the importance of quitting alcohol completely as it can exacerbate symptoms of skin blistering as well as follows elevation of ferritin with concurrent PCP.  Recent ultrasound right upper quadrant did not reveal any evidence of cirrhosis.  Macrocytic anemia: Likely secondary to alcohol use and B12 deficiency.  Will check B12 levels at next visit.  He has been receiving B12 shots until this month.  He will continue oral B12 1000 mcg daily.  Continue to monitor.  Patient also has mild leukopenia which has remained stable overall.  Continue to monitor   Visit Diagnosis 1. Porphyria cutanea tarda ([place])   2. Elevated ferritin   3. Macrocytic anemia   4. B12 deficiency      Dr. Randa Evens, MD, MPH [facility] [id number] 07/28/2019 8:51 AM

## 2019-08-02 ENCOUNTER — Inpatient Hospital Stay: Payer: Medicare Other

## 2019-08-09 ENCOUNTER — Inpatient Hospital Stay: Payer: Medicare Other

## 2019-08-16 ENCOUNTER — Inpatient Hospital Stay: Payer: Medicare Other

## 2019-08-23 ENCOUNTER — Other Ambulatory Visit: Payer: Medicare Other

## 2019-08-23 ENCOUNTER — Ambulatory Visit: Payer: Medicare Other | Admitting: Oncology

## 2019-09-08 ENCOUNTER — Ambulatory Visit: Payer: Medicare Other | Attending: Internal Medicine

## 2019-09-08 DIAGNOSIS — Z23 Encounter for immunization: Secondary | ICD-10-CM

## 2019-09-08 NOTE — Progress Notes (Signed)
   Covid-19 Vaccination Clinic  Name:  Shawn Gonzalez    MRN: [id number] DOB: 13-Aug-1955  09/08/2019  Shawn Gonzalez was observed post Covid-19 immunization for 15 minutes without incident. He was provided with Vaccine Information Sheet and instruction to access the V-Safe system.   Shawn Gonzalez was instructed to call 911 with any severe reactions post vaccine: Marland Kitchen Difficulty breathing  . Swelling of face and throat  . A fast heartbeat  . A bad rash all over body  . Dizziness and weakness   Immunizations Administered    Name Date Dose VIS Date Route   Pfizer COVID-19 Vaccine 09/08/2019  9:32 AM 0.3 mL 05/06/2019 Intramuscular   Manufacturer: [place]   Lot: [id number]   [place]: [id number]

## 2019-09-27 ENCOUNTER — Inpatient Hospital Stay: Payer: Medicare Other | Attending: Oncology

## 2019-09-27 ENCOUNTER — Inpatient Hospital Stay: Payer: Medicare Other

## 2019-10-05 ENCOUNTER — Ambulatory Visit: Payer: Medicare Other | Attending: Internal Medicine

## 2019-10-05 DIAGNOSIS — Z23 Encounter for immunization: Secondary | ICD-10-CM

## 2019-10-05 NOTE — Progress Notes (Signed)
   Covid-19 Vaccination Clinic  Name:  Shawn Gonzalez    MRN: [id number] DOB: 15-Aug-1955  10/05/2019  [name] was observed post Covid-19 immunization for 15 minutes without incident. He was provided with Vaccine Information Sheet and instruction to access the V-Safe system.   [name] was instructed to call 911 with any severe reactions post vaccine: Marland Kitchen Difficulty breathing  . Swelling of face and throat  . A fast heartbeat  . A bad rash all over body  . Dizziness and weakness   Immunizations Administered    Name Date Dose VIS Date Route   Pfizer COVID-19 Vaccine 10/05/2019  8:41 AM 0.3 mL 07/20/2018 Intramuscular   Manufacturer: [place]   Lot: [id number]   [place]: [id number]

## 2019-11-29 ENCOUNTER — Other Ambulatory Visit: Payer: Self-pay | Admitting: *Deleted

## 2019-11-29 ENCOUNTER — Inpatient Hospital Stay (HOSPITAL_BASED_OUTPATIENT_CLINIC_OR_DEPARTMENT_OTHER): Payer: Medicare Other | Admitting: Oncology

## 2019-11-29 ENCOUNTER — Inpatient Hospital Stay: Payer: Medicare Other | Attending: Oncology

## 2019-11-29 ENCOUNTER — Other Ambulatory Visit: Payer: Self-pay

## 2019-11-29 ENCOUNTER — Encounter: Payer: Self-pay | Admitting: Oncology

## 2019-11-29 ENCOUNTER — Inpatient Hospital Stay: Payer: Medicare Other

## 2019-11-29 ENCOUNTER — Telehealth: Payer: Self-pay | Admitting: *Deleted

## 2019-11-29 DIAGNOSIS — Z79899 Other long term (current) drug therapy: Secondary | ICD-10-CM | POA: Insufficient documentation

## 2019-11-29 DIAGNOSIS — F419 Anxiety disorder, unspecified: Secondary | ICD-10-CM | POA: Insufficient documentation

## 2019-11-29 DIAGNOSIS — F329 Major depressive disorder, single episode, unspecified: Secondary | ICD-10-CM | POA: Insufficient documentation

## 2019-11-29 DIAGNOSIS — R7989 Other specified abnormal findings of blood chemistry: Secondary | ICD-10-CM

## 2019-11-29 DIAGNOSIS — F17210 Nicotine dependence, cigarettes, uncomplicated: Secondary | ICD-10-CM | POA: Insufficient documentation

## 2019-11-29 DIAGNOSIS — D539 Nutritional anemia, unspecified: Secondary | ICD-10-CM

## 2019-11-29 DIAGNOSIS — I10 Essential (primary) hypertension: Secondary | ICD-10-CM | POA: Insufficient documentation

## 2019-11-29 DIAGNOSIS — D696 Thrombocytopenia, unspecified: Secondary | ICD-10-CM | POA: Diagnosis not present

## 2019-11-29 LAB — CBC WITH DIFFERENTIAL/PLATELET
Abs Immature Granulocytes: 0.01 10*3/uL (ref 0.00–0.07)
Basophils Absolute: 0 10*3/uL (ref 0.0–0.1)
Basophils Relative: 1 %
Eosinophils Absolute: 0 10*3/uL (ref 0.0–0.5)
Eosinophils Relative: 1 %
HCT: 40.5 % (ref 39.0–52.0)
Hemoglobin: 14.9 g/dL (ref 13.0–17.0)
Immature Granulocytes: 0 %
Lymphocytes Relative: 11 %
Lymphs Abs: 0.5 10*3/uL — ABNORMAL LOW (ref 0.7–4.0)
MCH: 36.3 pg — ABNORMAL HIGH (ref 26.0–34.0)
MCHC: 36.8 g/dL — ABNORMAL HIGH (ref 30.0–36.0)
MCV: 98.8 fL (ref 80.0–100.0)
Monocytes Absolute: 0.5 10*3/uL (ref 0.1–1.0)
Monocytes Relative: 11 %
Neutro Abs: 3.1 10*3/uL (ref 1.7–7.7)
Neutrophils Relative %: 76 %
Platelets: 140 10*3/uL — ABNORMAL LOW (ref 150–400)
RBC: 4.1 MIL/uL — ABNORMAL LOW (ref 4.22–5.81)
RDW: 13.1 % (ref 11.5–15.5)
WBC: 4.1 10*3/uL (ref 4.0–10.5)
nRBC: 0 % (ref 0.0–0.2)

## 2019-11-29 LAB — COMPREHENSIVE METABOLIC PANEL
ALT: 20 U/L (ref 0–44)
AST: 25 U/L (ref 15–41)
Albumin: 4.4 g/dL (ref 3.5–5.0)
Alkaline Phosphatase: 58 U/L (ref 38–126)
Anion gap: 10 (ref 5–15)
BUN: 11 mg/dL (ref 8–23)
CO2: 24 mmol/L (ref 22–32)
Calcium: 9 mg/dL (ref 8.9–10.3)
Chloride: 91 mmol/L — ABNORMAL LOW (ref 98–111)
Creatinine, Ser: 0.81 mg/dL (ref 0.61–1.24)
GFR calc Af Amer: >60 mL/min (ref >60)
GFR calc non Af Amer: >60 mL/min (ref >60)
Glucose, Bld: 127 mg/dL — ABNORMAL HIGH (ref 70–99)
Potassium: 4.4 mmol/L (ref 3.5–5.1)
Sodium: 125 mmol/L — ABNORMAL LOW (ref 135–145)
Total Bilirubin: 1.2 mg/dL (ref 0.3–1.2)
Total Protein: 7.5 g/dL (ref 6.5–8.1)

## 2019-11-29 LAB — FOLATE: Folate: 6.4 ng/mL (ref >5.9)

## 2019-11-29 LAB — VITAMIN B12: Vitamin B-12: 119 pg/mL — ABNORMAL LOW (ref 180–914)

## 2019-11-29 LAB — FERRITIN: Ferritin: 137 ng/mL (ref 24–336)

## 2019-11-29 NOTE — Telephone Encounter (Signed)
Patients ferritin 137 today, per Dr. Janese Banks patient needs follow up visits for lab (H&H, Ferritin) and phlebotomy every 2 weeks. Patient is agreeable to this plan and verbalized understanding. Patient will be called with follow up appointments.

## 2019-11-29 NOTE — Progress Notes (Signed)
Hematology/Oncology Consult note [facility]  Telephone:([phone] [phone]  Patient Care Team: Idelle Crouch, MD as PCP - General (Internal Medicine)   Name of the patient: Shawn Gonzalez  [id number]  April 22, 1956   Date of visit: 11/29/19  Diagnosis-porphyria cutanea tarda  Chief complaint/ Reason for visit-routine follow-up of porphyria cutanea tarda for possible phlebotomy  Heme/Onc history: Patient is a 64 year old male with a past medical history significant for hypertension, anxiety, chronic back pain and tobacco dependence. He has had on and off blistering of his bilateral hands and has seen dermatology in the past and was attributed to hematological disorder. Patient states that he has came to the cancer center many years ago and underwent phlebotomy at that time. He is unsure if he has hemochromatosis. Family history of any liver disease. He does report drinking alcohol sometimes he may have a few beers and 1 go sometimes he may go for days without drink. He also smokes 1 pack of cigarettes per day for the last 42 years.  Results of blood work from 12/10/2018 showed elevated ferritinof 772.HIV and hepatitis B and C testing was negative. HFE gene testing was negative.   Ref Range & Units 4wk ago  Uroporphyrins (UP) 0 - 20 ug/L 380High   Heptacarboxyl (7-CP) 0 - 2 ug/L 395High   Hexacarboxyl (6-CP) 0 - 1 ug/L <1   Pentacarboxyl (5-CP) 0 - 2 ug/L 38High   Coproporphyrin (CP) I 0 - 15 ug/L 64High   Coproporphyrin (CP) III 0 - 49 ug/L 58High     Interval history-patient drinks about 6 beers a day at least a few times a week.  Reports no hand blisters.  Has mild fatigue but denies other complaints1  ECOG PS- 1 Pain scale- 0   Review of systems- Review of Systems  Constitutional: Positive for malaise/fatigue. Negative for chills, fever and weight loss.  HENT: Negative for congestion, ear discharge and  nosebleeds.   Eyes: Negative for blurred vision.  Respiratory: Negative for cough, hemoptysis, sputum production, shortness of breath and wheezing.   Cardiovascular: Negative for chest pain, palpitations, orthopnea and claudication.  Gastrointestinal: Negative for abdominal pain, blood in stool, constipation, diarrhea, heartburn, melena, nausea and vomiting.  Genitourinary: Negative for dysuria, flank pain, frequency, hematuria and urgency.  Musculoskeletal: Negative for back pain, joint pain and myalgias.  Skin: Negative for rash.  Neurological: Negative for dizziness, tingling, focal weakness, seizures, weakness and headaches.  Endo/Heme/Allergies: Does not bruise/bleed easily.  Psychiatric/Behavioral: Negative for depression and suicidal ideas. The patient does not have insomnia.      Allergies  Allergen Reactions  . Celebrex [Celecoxib] Nausea Only     Past Medical History:  Diagnosis Date  . Anxiety   . Chronic low back pain   . Depression   . Elevated ferritin   . Erectile dysfunction   . History of chicken pox   . Hypertension   . Insomnia      Past Surgical History:  Procedure Laterality Date  . COLONOSCOPY WITH PROPOFOL N/A 10/31/2016   Procedure: COLONOSCOPY WITH PROPOFOL;  Surgeon: Manya Silvas, MD;  Location: [facility] ENDOSCOPY;  Service: Endoscopy;  Laterality: N/A;  . FINGER SURGERY Right    RT.INDEX  . MULTIPLE TOOTH EXTRACTIONS      Social History   Socioeconomic History  . Marital status: Married    Spouse name: Not on file  . Number of children: Not on file  . Years of education: Not on  file  . Highest education level: Not on file  Occupational History  . Not on file  Tobacco Use  . Smoking status: Current Every Day Smoker    Packs/day: 1.00    Years: 42.00    Pack years: 42.00    Types: Cigarettes  . Smokeless tobacco: Never Used  Vaping Use  . Vaping Use: Never used  Substance and Sexual Activity  . Alcohol use: Yes  . Drug use: No  .  Sexual activity: Not on file  Other Topics Concern  . Not on file  Social History Narrative  . Not on file   Social Determinants of Health   Financial Resource Strain:   . Difficulty of Paying Living Expenses:   Food Insecurity:   . Worried About Charity fundraiser in the Last Year:   . Arboriculturist in the Last Year:   Transportation Needs:   . Film/video editor (Medical):   Marland Kitchen Lack of Transportation (Non-Medical):   Physical Activity:   . Days of Exercise per Week:   . Minutes of Exercise per Session:   Stress:   . Feeling of Stress :   Social Connections:   . Frequency of Communication with Friends and Family:   . Frequency of Social Gatherings with Friends and Family:   . Attends Religious Services:   . Active Member of Clubs or Organizations:   . Attends Archivist Meetings:   Marland Kitchen Marital Status:   Intimate Partner Violence:   . Fear of Current or Ex-Partner:   . Emotionally Abused:   Marland Kitchen Physically Abused:   . Sexually Abused:     No family history on file.   Current Outpatient Medications:  .  amLODipine (NORVASC) 10 MG tablet, Take 10 mg by mouth daily., Disp: , Rfl:  .  bisoprolol-hydrochlorothiazide (ZIAC) 5-6.25 MG tablet, Take 1 tablet by mouth daily., Disp: , Rfl:  .  HYDROcodone-acetaminophen (NORCO/VICODIN) 5-325 MG tablet, Take 1 tablet by mouth every 6 (six) hours as needed for moderate pain., Disp: , Rfl:  .  lisinopril (PRINIVIL,ZESTRIL) 40 MG tablet, Take 40 mg by mouth daily., Disp: , Rfl:  .  sildenafil (VIAGRA) 50 MG tablet, Take 50 mg by mouth daily as needed. Take 1/2 tab po qd prn, Disp: , Rfl:  .  tiZANidine (ZANAFLEX) 4 MG tablet, Take 4 mg by mouth 3 (three) times daily as needed., Disp: , Rfl:  .  ALPRAZolam (XANAX) 0.25 MG tablet, Take 0.25 mg by mouth 2 (two) times daily as needed for anxiety or sleep. (Patient not taking: Reported on 11/29/2019), Disp: , Rfl:  .  chlorhexidine (PERIDEX) 0.12 % solution, 1 mL by Mouth Rinse  route 1 day or 1 dose. (Patient not taking: Reported on 11/29/2019), Disp: , Rfl:  .  cyclobenzaprine (FLEXERIL) 10 MG tablet, Take 10 mg by mouth 3 (three) times daily as needed for muscle spasms. (Patient not taking: Reported on 11/29/2019), Disp: , Rfl:   Physical exam:  Vitals:   11/29/19 1110  BP: 130/79  Pulse: (!) 58  Resp: 16  Temp: (!) 97.1 F (36.2 C)  TempSrc: Tympanic  SpO2: 99%  Weight: 157 lb 14.4 oz (71.6 kg)   Physical Exam Constitutional:      General: He is not in acute distress. Pulmonary:     Effort: Pulmonary effort is normal.  Abdominal:     General: Bowel sounds are normal.     Palpations: Abdomen is soft.  Skin:  General: Skin is warm and dry.  Neurological:     Mental Status: He is alert and oriented to person, place, and time.      CMP Latest Ref Rng & Units 11/29/2019  Glucose 70 - 99 mg/dL 127(H)  BUN 8 - 23 mg/dL 11  Creatinine 0.61 - 1.24 mg/dL 0.81  Sodium 135 - 145 mmol/L 125(L)  Potassium 3.5 - 5.1 mmol/L 4.4  Chloride 98 - 111 mmol/L 91(L)  CO2 22 - 32 mmol/L 24  Calcium 8.9 - 10.3 mg/dL 9.0  Total Protein 6.5 - 8.1 g/dL 7.5  Total Bilirubin 0.3 - 1.2 mg/dL 1.2  Alkaline Phos 38 - 126 U/L 58  AST 15 - 41 U/L 25  ALT 0 - 44 U/L 20   CBC Latest Ref Rng & Units 11/29/2019  WBC 4.0 - 10.5 K/uL 4.1  Hemoglobin 13.0 - 17.0 g/dL 14.9  Hematocrit 39 - 52 % 40.5  Platelets 150 - 400 K/uL 140(L)     Assessment and plan- Patient is a 64 y.o. male with porphyria cutanea tarda here for routine follow-up  Ferritin levels are pending.  CBC is normal except for mild thrombocytopenia which we will continue to monitor.  LFTs are presently normal.Suspect hyponatremia and hypochloremia secondary to ongoing alcohol intake and have strongly advised the patient to abstain or at least cut down on his alcohol intake.  This can further worsen his porphyria cutanea tarda and elevate his ferritin levels.  We will call the patient after his ferritin levels are  back.  If levels are less than 100 then he does not need a phlebotomy at this time.  I will see him back in 3 months with CBC with differential CMP and ferritin   Visit Diagnosis 1. Porphyria cutanea tarda ([place])      Dr. Randa Evens, MD, MPH [facility] at [facility] [phone] 11/29/2019 12:38 PM

## 2019-11-29 NOTE — Progress Notes (Signed)
yes

## 2019-12-01 ENCOUNTER — Inpatient Hospital Stay: Payer: Medicare Other

## 2019-12-01 ENCOUNTER — Other Ambulatory Visit: Payer: Self-pay

## 2019-12-01 VITALS — BP 110/68 | HR 56 | Temp 99.0°F | Resp 18

## 2019-12-01 DIAGNOSIS — R7989 Other specified abnormal findings of blood chemistry: Secondary | ICD-10-CM

## 2019-12-01 MED ORDER — CYANOCOBALAMIN 1000 MCG/ML IJ SOLN
1000.0000 ug | INTRAMUSCULAR | Status: DC
  Administered 2019-12-01: 1000 ug via INTRAMUSCULAR

## 2019-12-13 ENCOUNTER — Inpatient Hospital Stay: Payer: Medicare Other

## 2019-12-13 ENCOUNTER — Other Ambulatory Visit: Payer: Self-pay

## 2019-12-13 DIAGNOSIS — R7989 Other specified abnormal findings of blood chemistry: Secondary | ICD-10-CM

## 2019-12-13 LAB — HEMOGLOBIN AND HEMATOCRIT, BLOOD
HCT: 38.6 % — ABNORMAL LOW (ref 39.0–52.0)
Hemoglobin: 14.2 g/dL (ref 13.0–17.0)

## 2019-12-13 LAB — FERRITIN: Ferritin: 97 ng/mL (ref 24–336)

## 2019-12-27 ENCOUNTER — Inpatient Hospital Stay: Payer: Medicare Other

## 2019-12-27 ENCOUNTER — Inpatient Hospital Stay: Payer: Medicare Other | Attending: Oncology

## 2019-12-27 ENCOUNTER — Other Ambulatory Visit: Payer: Self-pay

## 2019-12-27 ENCOUNTER — Telehealth: Payer: Self-pay | Admitting: *Deleted

## 2019-12-27 DIAGNOSIS — D696 Thrombocytopenia, unspecified: Secondary | ICD-10-CM | POA: Insufficient documentation

## 2019-12-27 DIAGNOSIS — R7989 Other specified abnormal findings of blood chemistry: Secondary | ICD-10-CM | POA: Diagnosis present

## 2019-12-27 DIAGNOSIS — I10 Essential (primary) hypertension: Secondary | ICD-10-CM | POA: Insufficient documentation

## 2019-12-27 DIAGNOSIS — F17210 Nicotine dependence, cigarettes, uncomplicated: Secondary | ICD-10-CM | POA: Insufficient documentation

## 2019-12-27 DIAGNOSIS — F329 Major depressive disorder, single episode, unspecified: Secondary | ICD-10-CM | POA: Insufficient documentation

## 2019-12-27 DIAGNOSIS — Z79899 Other long term (current) drug therapy: Secondary | ICD-10-CM | POA: Diagnosis not present

## 2019-12-27 DIAGNOSIS — F419 Anxiety disorder, unspecified: Secondary | ICD-10-CM | POA: Insufficient documentation

## 2019-12-27 LAB — HEMOGLOBIN AND HEMATOCRIT, BLOOD
HCT: 38.2 % — ABNORMAL LOW (ref 39.0–52.0)
Hemoglobin: 13.9 g/dL (ref 13.0–17.0)

## 2019-12-27 LAB — FERRITIN: Ferritin: 80 ng/mL (ref 24–336)

## 2019-12-27 NOTE — Telephone Encounter (Signed)
Pt ws scheduled for phlebotomy today but ferritin is not back . Last time it was 97. It takes 3 hours to get the results back. Pt going home and I will call him and the results came back as 80. He does not need phlebotomy and next visit 8/17 and he will come for labs and I made his appt for phlebotomy the next day and I will let him know if he needs to come for phlebotomy on 8/18. He is agreeable to the plan

## 2020-01-10 ENCOUNTER — Inpatient Hospital Stay: Payer: Medicare Other

## 2020-01-10 ENCOUNTER — Other Ambulatory Visit: Payer: Self-pay

## 2020-01-10 DIAGNOSIS — R7989 Other specified abnormal findings of blood chemistry: Secondary | ICD-10-CM

## 2020-01-10 LAB — HEMOGLOBIN AND HEMATOCRIT, BLOOD
HCT: 38.8 % — ABNORMAL LOW (ref 39.0–52.0)
Hemoglobin: 14.1 g/dL (ref 13.0–17.0)

## 2020-01-10 LAB — FERRITIN: Ferritin: 134 ng/mL (ref 24–336)

## 2020-01-11 ENCOUNTER — Telehealth: Payer: Self-pay | Admitting: Oncology

## 2020-01-11 ENCOUNTER — Inpatient Hospital Stay: Payer: Medicare Other

## 2020-01-11 NOTE — Telephone Encounter (Signed)
Patient phoned on this date and requested to move phlebotomy to 01-12-20. Appt moved per patient's request.

## 2020-01-12 ENCOUNTER — Inpatient Hospital Stay: Payer: Medicare Other

## 2020-01-12 ENCOUNTER — Other Ambulatory Visit: Payer: Self-pay

## 2020-01-12 VITALS — BP 130/73 | HR 58 | Temp 97.0°F

## 2020-01-12 DIAGNOSIS — R7989 Other specified abnormal findings of blood chemistry: Secondary | ICD-10-CM | POA: Diagnosis not present

## 2020-01-12 MED ORDER — CYANOCOBALAMIN 1000 MCG/ML IJ SOLN
1000.0000 ug | INTRAMUSCULAR | Status: DC
  Administered 2020-01-12: 1000 ug via INTRAMUSCULAR

## 2020-01-24 ENCOUNTER — Inpatient Hospital Stay: Payer: Medicare Other

## 2020-01-24 ENCOUNTER — Other Ambulatory Visit: Payer: Self-pay | Admitting: Oncology

## 2020-01-24 ENCOUNTER — Other Ambulatory Visit: Payer: Self-pay

## 2020-01-24 DIAGNOSIS — R7989 Other specified abnormal findings of blood chemistry: Secondary | ICD-10-CM

## 2020-01-24 DIAGNOSIS — D539 Nutritional anemia, unspecified: Secondary | ICD-10-CM

## 2020-01-24 LAB — CBC WITH DIFFERENTIAL/PLATELET
Abs Immature Granulocytes: 0.01 10*3/uL (ref 0.00–0.07)
Basophils Absolute: 0 10*3/uL (ref 0.0–0.1)
Basophils Relative: 1 %
Eosinophils Absolute: 0 10*3/uL (ref 0.0–0.5)
Eosinophils Relative: 1 %
HCT: 39 % (ref 39.0–52.0)
Hemoglobin: 14.5 g/dL (ref 13.0–17.0)
Immature Granulocytes: 0 %
Lymphocytes Relative: 41 %
Lymphs Abs: 1.6 10*3/uL (ref 0.7–4.0)
MCH: 36.3 pg — ABNORMAL HIGH (ref 26.0–34.0)
MCHC: 37.2 g/dL — ABNORMAL HIGH (ref 30.0–36.0)
MCV: 97.5 fL (ref 80.0–100.0)
Monocytes Absolute: 0.4 10*3/uL (ref 0.1–1.0)
Monocytes Relative: 9 %
Neutro Abs: 1.9 10*3/uL (ref 1.7–7.7)
Neutrophils Relative %: 48 %
Platelets: 151 10*3/uL (ref 150–400)
RBC: 4 MIL/uL — ABNORMAL LOW (ref 4.22–5.81)
RDW: 12.6 % (ref 11.5–15.5)
WBC: 3.9 10*3/uL — ABNORMAL LOW (ref 4.0–10.5)
nRBC: 0 % (ref 0.0–0.2)

## 2020-01-24 LAB — COMPREHENSIVE METABOLIC PANEL
ALT: 29 U/L (ref 0–44)
AST: 33 U/L (ref 15–41)
Albumin: 4.1 g/dL (ref 3.5–5.0)
Alkaline Phosphatase: 56 U/L (ref 38–126)
Anion gap: 7 (ref 5–15)
BUN: 7 mg/dL — ABNORMAL LOW (ref 8–23)
CO2: 25 mmol/L (ref 22–32)
Calcium: 9.1 mg/dL (ref 8.9–10.3)
Chloride: 97 mmol/L — ABNORMAL LOW (ref 98–111)
Creatinine, Ser: 0.78 mg/dL (ref 0.61–1.24)
GFR calc Af Amer: >60 mL/min (ref >60)
GFR calc non Af Amer: >60 mL/min (ref >60)
Glucose, Bld: 145 mg/dL — ABNORMAL HIGH (ref 70–99)
Potassium: 4.1 mmol/L (ref 3.5–5.1)
Sodium: 129 mmol/L — ABNORMAL LOW (ref 135–145)
Total Bilirubin: 1 mg/dL (ref 0.3–1.2)
Total Protein: 7.6 g/dL (ref 6.5–8.1)

## 2020-01-24 LAB — VITAMIN B12: Vitamin B-12: 241 pg/mL (ref 180–914)

## 2020-01-24 LAB — FERRITIN: Ferritin: 104 ng/mL (ref 24–336)

## 2020-01-24 LAB — FOLATE: Folate: 5.9 ng/mL — ABNORMAL LOW (ref >5.9)

## 2020-01-25 ENCOUNTER — Inpatient Hospital Stay: Payer: Medicare Other | Attending: Oncology

## 2020-01-25 DIAGNOSIS — R7989 Other specified abnormal findings of blood chemistry: Secondary | ICD-10-CM | POA: Diagnosis present

## 2020-02-02 NOTE — Progress Notes (Signed)
Tried called 2 times this week and no answer and he does not have voicemail. Will try again

## 2020-02-07 ENCOUNTER — Inpatient Hospital Stay: Payer: Medicare Other

## 2020-02-21 ENCOUNTER — Inpatient Hospital Stay: Payer: Medicare Other

## 2020-02-21 ENCOUNTER — Telehealth: Payer: Self-pay | Admitting: Oncology

## 2020-02-21 ENCOUNTER — Other Ambulatory Visit: Payer: Self-pay | Admitting: Oncology

## 2020-02-21 ENCOUNTER — Other Ambulatory Visit: Payer: Self-pay

## 2020-02-21 VITALS — BP 120/68 | HR 60 | Temp 96.8°F | Resp 18

## 2020-02-21 DIAGNOSIS — R7989 Other specified abnormal findings of blood chemistry: Secondary | ICD-10-CM | POA: Diagnosis not present

## 2020-02-21 LAB — CBC WITH DIFFERENTIAL/PLATELET
Abs Immature Granulocytes: 0.01 10*3/uL (ref 0.00–0.07)
Basophils Absolute: 0 10*3/uL (ref 0.0–0.1)
Basophils Relative: 1 %
Eosinophils Absolute: 0.1 10*3/uL (ref 0.0–0.5)
Eosinophils Relative: 2 %
HCT: 38.8 % — ABNORMAL LOW (ref 39.0–52.0)
Hemoglobin: 14 g/dL (ref 13.0–17.0)
Immature Granulocytes: 0 %
Lymphocytes Relative: 28 %
Lymphs Abs: 1.1 10*3/uL (ref 0.7–4.0)
MCH: 35.4 pg — ABNORMAL HIGH (ref 26.0–34.0)
MCHC: 36.1 g/dL — ABNORMAL HIGH (ref 30.0–36.0)
MCV: 98 fL (ref 80.0–100.0)
Monocytes Absolute: 0.3 10*3/uL (ref 0.1–1.0)
Monocytes Relative: 8 %
Neutro Abs: 2.4 10*3/uL (ref 1.7–7.7)
Neutrophils Relative %: 61 %
Platelets: 152 10*3/uL (ref 150–400)
RBC: 3.96 MIL/uL — ABNORMAL LOW (ref 4.22–5.81)
RDW: 12.6 % (ref 11.5–15.5)
WBC: 3.9 10*3/uL — ABNORMAL LOW (ref 4.0–10.5)
nRBC: 0 % (ref 0.0–0.2)

## 2020-02-21 LAB — COMPREHENSIVE METABOLIC PANEL
ALT: 15 U/L (ref 0–44)
AST: 20 U/L (ref 15–41)
Albumin: 4.2 g/dL (ref 3.5–5.0)
Alkaline Phosphatase: 52 U/L (ref 38–126)
Anion gap: 10 (ref 5–15)
BUN: 11 mg/dL (ref 8–23)
CO2: 23 mmol/L (ref 22–32)
Calcium: 8.8 mg/dL — ABNORMAL LOW (ref 8.9–10.3)
Chloride: 98 mmol/L (ref 98–111)
Creatinine, Ser: 0.76 mg/dL (ref 0.61–1.24)
GFR calc Af Amer: >60 mL/min (ref >60)
GFR calc non Af Amer: >60 mL/min (ref >60)
Glucose, Bld: 135 mg/dL — ABNORMAL HIGH (ref 70–99)
Potassium: 4 mmol/L (ref 3.5–5.1)
Sodium: 131 mmol/L — ABNORMAL LOW (ref 135–145)
Total Bilirubin: 1 mg/dL (ref 0.3–1.2)
Total Protein: 7.4 g/dL (ref 6.5–8.1)

## 2020-02-21 LAB — FERRITIN: Ferritin: 31 ng/mL (ref 24–336)

## 2020-02-21 NOTE — Telephone Encounter (Signed)
Writer was informed that patient does not need his next phlebotomy. Lab and MD remain scheduled for 03-06-20.

## 2020-02-21 NOTE — Progress Notes (Addendum)
Per Dr Janese Banks, proceed with therapeutic phlebotomy today. Hgb 14 and hct 38.8   250cc of blood removed via therapeutic phlebotomy. Pt tolerated well. VSS at discharge.

## 2020-02-21 NOTE — Progress Notes (Signed)
I was able to speak to pt today and he wants me to mail or test his wife with the folic acid and dose and b12 pill and dose. I texted her the info because pt does not have cell phone and does not have my chart and he wanted me to mail it and then he decided that I could text wife.

## 2020-03-01 ENCOUNTER — Ambulatory Visit: Payer: Medicare Other | Admitting: Oncology

## 2020-03-01 ENCOUNTER — Other Ambulatory Visit: Payer: Medicare Other

## 2020-03-06 ENCOUNTER — Inpatient Hospital Stay: Payer: Medicare Other

## 2020-03-06 ENCOUNTER — Inpatient Hospital Stay: Payer: Medicare Other | Admitting: Oncology

## 2020-03-16 ENCOUNTER — Other Ambulatory Visit: Payer: Self-pay

## 2020-03-16 ENCOUNTER — Inpatient Hospital Stay: Payer: Medicare Other | Attending: Oncology

## 2020-03-16 ENCOUNTER — Inpatient Hospital Stay (HOSPITAL_BASED_OUTPATIENT_CLINIC_OR_DEPARTMENT_OTHER): Payer: Medicare Other | Admitting: Oncology

## 2020-03-16 VITALS — BP 145/73 | HR 60 | Temp 98.9°F | Wt 154.0 lb

## 2020-03-16 DIAGNOSIS — I10 Essential (primary) hypertension: Secondary | ICD-10-CM | POA: Diagnosis not present

## 2020-03-16 DIAGNOSIS — F329 Major depressive disorder, single episode, unspecified: Secondary | ICD-10-CM | POA: Insufficient documentation

## 2020-03-16 DIAGNOSIS — D539 Nutritional anemia, unspecified: Secondary | ICD-10-CM | POA: Diagnosis not present

## 2020-03-16 DIAGNOSIS — R7989 Other specified abnormal findings of blood chemistry: Secondary | ICD-10-CM

## 2020-03-16 DIAGNOSIS — F17210 Nicotine dependence, cigarettes, uncomplicated: Secondary | ICD-10-CM | POA: Diagnosis not present

## 2020-03-16 DIAGNOSIS — Z79899 Other long term (current) drug therapy: Secondary | ICD-10-CM | POA: Insufficient documentation

## 2020-03-16 DIAGNOSIS — F419 Anxiety disorder, unspecified: Secondary | ICD-10-CM | POA: Insufficient documentation

## 2020-03-16 LAB — CBC WITH DIFFERENTIAL/PLATELET
Abs Immature Granulocytes: 0.01 10*3/uL (ref 0.00–0.07)
Basophils Absolute: 0 10*3/uL (ref 0.0–0.1)
Basophils Relative: 1 %
Eosinophils Absolute: 0.2 10*3/uL (ref 0.0–0.5)
Eosinophils Relative: 5 %
HCT: 39.5 % (ref 39.0–52.0)
Hemoglobin: 14 g/dL (ref 13.0–17.0)
Immature Granulocytes: 0 %
Lymphocytes Relative: 33 %
Lymphs Abs: 1.4 10*3/uL (ref 0.7–4.0)
MCH: 35.1 pg — ABNORMAL HIGH (ref 26.0–34.0)
MCHC: 35.4 g/dL (ref 30.0–36.0)
MCV: 99 fL (ref 80.0–100.0)
Monocytes Absolute: 0.4 10*3/uL (ref 0.1–1.0)
Monocytes Relative: 9 %
Neutro Abs: 2.4 10*3/uL (ref 1.7–7.7)
Neutrophils Relative %: 52 %
Platelets: 146 10*3/uL — ABNORMAL LOW (ref 150–400)
RBC: 3.99 MIL/uL — ABNORMAL LOW (ref 4.22–5.81)
RDW: 12.6 % (ref 11.5–15.5)
WBC: 4.4 10*3/uL (ref 4.0–10.5)
nRBC: 0 % (ref 0.0–0.2)

## 2020-03-16 LAB — COMPREHENSIVE METABOLIC PANEL
ALT: 14 U/L (ref 0–44)
AST: 19 U/L (ref 15–41)
Albumin: 4 g/dL (ref 3.5–5.0)
Alkaline Phosphatase: 51 U/L (ref 38–126)
Anion gap: 7 (ref 5–15)
BUN: 15 mg/dL (ref 8–23)
CO2: 25 mmol/L (ref 22–32)
Calcium: 8.7 mg/dL — ABNORMAL LOW (ref 8.9–10.3)
Chloride: 100 mmol/L (ref 98–111)
Creatinine, Ser: 0.85 mg/dL (ref 0.61–1.24)
GFR, Estimated: >60 mL/min (ref >60)
Glucose, Bld: 107 mg/dL — ABNORMAL HIGH (ref 70–99)
Potassium: 3.9 mmol/L (ref 3.5–5.1)
Sodium: 132 mmol/L — ABNORMAL LOW (ref 135–145)
Total Bilirubin: 0.7 mg/dL (ref 0.3–1.2)
Total Protein: 7.1 g/dL (ref 6.5–8.1)

## 2020-03-16 LAB — FERRITIN: Ferritin: 37 ng/mL (ref 24–336)

## 2020-03-18 ENCOUNTER — Encounter: Payer: Self-pay | Admitting: Oncology

## 2020-03-18 NOTE — Progress Notes (Signed)
Hematology/Oncology Consult note [facility]  Telephone:([phone] Fax:([phone]  Patient Care Team: Idelle Crouch, MD as PCP - General (Internal Medicine)   Name of the patient: Shawn Gonzalez  [id number]  [birth year]   Date of visit: 03/18/20  Diagnosis-porphyria cutanea tarda  Chief complaint/ Reason for visit-routine follow-up of porphyria cutanea tarda for possible phlebotomy  Heme/Onc history: Patient is a 64 year old male with a past medical history significant for hypertension, anxiety, chronic back pain and tobacco dependence. He has had on and off blistering of his bilateral hands and has seen dermatology in the past and was attributed to hematological disorder. Patient states that he has came to the cancer center many years ago and underwent phlebotomy at that time. He is unsure if he has hemochromatosis. Family history of any liver disease. He does report drinking alcohol sometimes he may have a few beers and 1 go sometimes he may go for days without drink. He also smokes 1 pack of cigarettes per day for the last 42 years.  Results of blood work from 12/10/2018 showed elevated ferritinof 772.HIV and hepatitis B and C testing was negative. HFE gene testing was negative.   Ref Range & Units 4wk ago  Uroporphyrins (UP) 0 - 20 ug/L 380High   Heptacarboxyl (7-CP) 0 - 2 ug/L 395High   Hexacarboxyl (6-CP) 0 - 1 ug/L <1   Pentacarboxyl (5-CP) 0 - 2 ug/L 38High   Coproporphyrin (CP) I 0 - 15 ug/L 64High   Coproporphyrin (CP) III 0 - 49 ug/L 58High    Overall results consistent with porphyria cutanea tarda and patient was started on phlebotomy to keep initial ferritin less than 25 followed by maintenance phlebotomy if ferritin is greater than 100    Interval history-patient denies any symptoms of blistering rash in his extremities.  He continues to drink about 3-4 beers daily.  Appetite and weight have remained stable  and he denies other complaints at this time  ECOG PS- 1 Pain scale- 0   Review of systems- Review of Systems  Constitutional: Negative for chills, fever, malaise/fatigue and weight loss.  HENT: Negative for congestion, ear discharge and nosebleeds.   Eyes: Negative for blurred vision.  Respiratory: Negative for cough, hemoptysis, sputum production, shortness of breath and wheezing.   Cardiovascular: Negative for chest pain, palpitations, orthopnea and claudication.  Gastrointestinal: Negative for abdominal pain, blood in stool, constipation, diarrhea, heartburn, melena, nausea and vomiting.  Genitourinary: Negative for dysuria, flank pain, frequency, hematuria and urgency.  Musculoskeletal: Negative for back pain, joint pain and myalgias.  Skin: Negative for rash.  Neurological: Negative for dizziness, tingling, focal weakness, seizures, weakness and headaches.  Endo/Heme/Allergies: Does not bruise/bleed easily.  Psychiatric/Behavioral: Negative for depression and suicidal ideas. The patient does not have insomnia.        Allergies  Allergen Reactions  . Celebrex [Celecoxib] Nausea Only     Past Medical History:  Diagnosis Date  . Anxiety   . Chronic low back pain   . Depression   . Elevated ferritin   . Erectile dysfunction   . History of chicken pox   . Hypertension   . Insomnia      Past Surgical History:  Procedure Laterality Date  . COLONOSCOPY WITH PROPOFOL N/A 10/31/2016   Procedure: COLONOSCOPY WITH PROPOFOL;  Surgeon: Manya Silvas, MD;  Location: [facility] ENDOSCOPY;  Service: Endoscopy;  Laterality: N/A;  . FINGER SURGERY Right    RT.INDEX  . MULTIPLE TOOTH EXTRACTIONS  Social History   Socioeconomic History  . Marital status: Married    Spouse name: Not on file  . Number of children: Not on file  . Years of education: Not on file  . Highest education level: Not on file  Occupational History  . Not on file  Tobacco Use  . Smoking status:  Current Every Day Smoker    Packs/day: 1.00    Years: 42.00    Pack years: 42.00    Types: Cigarettes  . Smokeless tobacco: Never Used  Vaping Use  . Vaping Use: Never used  Substance and Sexual Activity  . Alcohol use: Yes  . Drug use: No  . Sexual activity: Not on file  Other Topics Concern  . Not on file  Social History Narrative  . Not on file   Social Determinants of Health   Financial Resource Strain:   . Difficulty of Paying Living Expenses: Not on file  Food Insecurity:   . Worried About Charity fundraiser in the Last Year: Not on file  . Ran Out of Food in the Last Year: Not on file  Transportation Needs:   . Lack of Transportation (Medical): Not on file  . Lack of Transportation (Non-Medical): Not on file  Physical Activity:   . Days of Exercise per Week: Not on file  . Minutes of Exercise per Session: Not on file  Stress:   . Feeling of Stress : Not on file  Social Connections:   . Frequency of Communication with Friends and Family: Not on file  . Frequency of Social Gatherings with Friends and Family: Not on file  . Attends Religious Services: Not on file  . Active Member of Clubs or Organizations: Not on file  . Attends Archivist Meetings: Not on file  . Marital Status: Not on file  Intimate Partner Violence:   . Fear of Current or Ex-Partner: Not on file  . Emotionally Abused: Not on file  . Physically Abused: Not on file  . Sexually Abused: Not on file    No family history on file.   Current Outpatient Medications:  .  ALPRAZolam (XANAX) 0.25 MG tablet, Take 0.25 mg by mouth 2 (two) times daily as needed for anxiety or sleep. , Disp: , Rfl:  .  amLODipine (NORVASC) 10 MG tablet, Take 10 mg by mouth daily., Disp: , Rfl:  .  bisoprolol-hydrochlorothiazide (ZIAC) 5-6.25 MG tablet, Take 1 tablet by mouth daily., Disp: , Rfl:  .  cyclobenzaprine (FLEXERIL) 10 MG tablet, Take 10 mg by mouth 3 (three) times daily as needed for muscle spasms. ,  Disp: , Rfl:  .  HYDROcodone-acetaminophen (NORCO/VICODIN) 5-325 MG tablet, Take 1 tablet by mouth every 6 (six) hours as needed for moderate pain., Disp: , Rfl:  .  lisinopril (PRINIVIL,ZESTRIL) 40 MG tablet, Take 40 mg by mouth daily., Disp: , Rfl:   Physical exam:  Vitals:   03/16/20 1015  BP: (!) 145/73  Pulse: 60  Temp: 98.9 F (37.2 C)  TempSrc: Tympanic  SpO2: 98%  Weight: 154 lb (69.9 kg)   Physical Exam Constitutional:      Comments: Face appears plethoric  Cardiovascular:     Rate and Rhythm: Normal rate and regular rhythm.     Heart sounds: Normal heart sounds.  Pulmonary:     Effort: Pulmonary effort is normal.     Breath sounds: Normal breath sounds.  Abdominal:     General: Bowel sounds are normal.  Palpations: Abdomen is soft.  Skin:    General: Skin is warm and dry.  Neurological:     Mental Status: He is alert and oriented to person, place, and time.      CMP Latest Ref Rng & Units 03/16/2020  Glucose 70 - 99 mg/dL 107(H)  BUN 8 - 23 mg/dL 15  Creatinine 0.61 - 1.24 mg/dL 0.85  Sodium 135 - 145 mmol/L 132(L)  Potassium 3.5 - 5.1 mmol/L 3.9  Chloride 98 - 111 mmol/L 100  CO2 22 - 32 mmol/L 25  Calcium 8.9 - 10.3 mg/dL 8.7(L)  Total Protein 6.5 - 8.1 g/dL 7.1  Total Bilirubin 0.3 - 1.2 mg/dL 0.7  Alkaline Phos 38 - 126 U/L 51  AST 15 - 41 U/L 19  ALT 0 - 44 U/L 14   CBC Latest Ref Rng & Units 03/16/2020  WBC 4.0 - 10.5 K/uL 4.4  Hemoglobin 13.0 - 17.0 g/dL 14.0  Hematocrit 39 - 52 % 39.5  Platelets 150 - 400 K/uL 146(L)    Assessment and plan- Patient is a 64 y.o. male with porphyria cutanea tarda here for routine follow-up for possible phlebotomy  Ferritin came back at 53 and presently goal is to reinitiate phlebotomy if it goes to more than 100.  Patient has stable chronic thrombocytopenia likely secondary to alcohol use.  LFTs are presently normal.  He does not require phlebotomy today.  I have again encouraged the patient to cut down his  use of alcohol further if it is not possible for him to completely quit.   CBC with differential and CMP in 6 months and I will see him back in 6 months.  He also had a history of B12 and folate deficiency which we will repeat in 6 months   Visit Diagnosis 1. Elevated ferritin level   2. Macrocytic anemia      Dr. Randa Evens, MD, MPH [facility] at [facility] [phone] 03/18/2020 10:24 AM

## 2020-04-11 ENCOUNTER — Ambulatory Visit: Payer: Self-pay | Admitting: Surgery

## 2020-04-11 NOTE — H&P (Signed)
SURGICAL PATHOLOGY  CASE: [phone]  PATIENT: Shawn Gonzalez  Surgical Pathology Report      Specimen Submitted:  A. Gallbladder   Clinical History: P89.84 biliary colic      DIAGNOSIS:  A. GALLBLADDER; CHOLECYSTECTOMY:  - CHOLELITHIASIS AND CHRONIC CHOLECYSTITIS.  - NEGATIVE FOR DYSPLASIA AND MALIGNANCY.   GROSS DESCRIPTION:  A. Labeled: Gallbladder  Received: In formalin  Size of specimen: 12 x 4 x 3.5 cm  Specimen integrity: Intact  External surface: Pink-red and relatively smooth  Wall thickness: Thickened and fatty, 0.2-0.5 cm  Mucosa: Pink-tan with a vague reticular pattern  Cystic duct: 0.6 cm  Bile present: Yes, green mucoid  Stones present: There are multiple green-brown calculi noted from 0.8 up  to 2.2 cm in diameter noted  Other findings: None   Block summary:  1 -representative sections    Final Diagnosis performed by Bryan Lemma, MD.  Electronically signed  03/30/2020 4:35:24PM  The electronic signature indicates that the named Attending Pathologist  has evaluated the specimen  Technical component performed at [facility], [address] [place],  [place] [address] Lab: [phone] Dir: Rush Farmer, MD, MMM  Professional component performed at [facility], [facility], [place], [place], [place] [address] Lab: [phone]  Dir: Dellia Nims. Reuel Derby, MD

## 2020-06-15 ENCOUNTER — Inpatient Hospital Stay: Payer: Medicare Other | Attending: Oncology

## 2020-06-15 DIAGNOSIS — R7989 Other specified abnormal findings of blood chemistry: Secondary | ICD-10-CM

## 2020-06-15 DIAGNOSIS — D539 Nutritional anemia, unspecified: Secondary | ICD-10-CM | POA: Diagnosis not present

## 2020-06-15 DIAGNOSIS — Z79899 Other long term (current) drug therapy: Secondary | ICD-10-CM | POA: Insufficient documentation

## 2020-06-15 DIAGNOSIS — F17210 Nicotine dependence, cigarettes, uncomplicated: Secondary | ICD-10-CM | POA: Diagnosis not present

## 2020-06-15 LAB — COMPREHENSIVE METABOLIC PANEL
ALT: 13 U/L (ref 0–44)
AST: 16 U/L (ref 15–41)
Albumin: 4.3 g/dL (ref 3.5–5.0)
Alkaline Phosphatase: 56 U/L (ref 38–126)
Anion gap: 6 (ref 5–15)
BUN: 15 mg/dL (ref 8–23)
CO2: 27 mmol/L (ref 22–32)
Calcium: 9.3 mg/dL (ref 8.9–10.3)
Chloride: 99 mmol/L (ref 98–111)
Creatinine, Ser: 0.93 mg/dL (ref 0.61–1.24)
GFR, Estimated: >60 mL/min (ref >60)
Glucose, Bld: 98 mg/dL (ref 70–99)
Potassium: 3.8 mmol/L (ref 3.5–5.1)
Sodium: 132 mmol/L — ABNORMAL LOW (ref 135–145)
Total Bilirubin: 1.1 mg/dL (ref 0.3–1.2)
Total Protein: 7.7 g/dL (ref 6.5–8.1)

## 2020-06-15 LAB — CBC WITH DIFFERENTIAL/PLATELET
Abs Immature Granulocytes: 0.01 10*3/uL (ref 0.00–0.07)
Basophils Absolute: 0 10*3/uL (ref 0.0–0.1)
Basophils Relative: 0 %
Eosinophils Absolute: 0.2 10*3/uL (ref 0.0–0.5)
Eosinophils Relative: 3 %
HCT: 39.2 % (ref 39.0–52.0)
Hemoglobin: 14.3 g/dL (ref 13.0–17.0)
Immature Granulocytes: 0 %
Lymphocytes Relative: 36 %
Lymphs Abs: 1.7 10*3/uL (ref 0.7–4.0)
MCH: 36.4 pg — ABNORMAL HIGH (ref 26.0–34.0)
MCHC: 36.5 g/dL — ABNORMAL HIGH (ref 30.0–36.0)
MCV: 99.7 fL (ref 80.0–100.0)
Monocytes Absolute: 0.5 10*3/uL (ref 0.1–1.0)
Monocytes Relative: 12 %
Neutro Abs: 2.2 10*3/uL (ref 1.7–7.7)
Neutrophils Relative %: 49 %
Platelets: 172 10*3/uL (ref 150–400)
RBC: 3.93 MIL/uL — ABNORMAL LOW (ref 4.22–5.81)
RDW: 13.6 % (ref 11.5–15.5)
WBC: 4.6 10*3/uL (ref 4.0–10.5)
nRBC: 0 % (ref 0.0–0.2)

## 2020-06-15 LAB — FERRITIN: Ferritin: 133 ng/mL (ref 24–336)

## 2020-07-02 ENCOUNTER — Inpatient Hospital Stay: Admission: RE | Admit: 2020-07-02 | Payer: Medicare Other | Source: Ambulatory Visit

## 2020-07-10 ENCOUNTER — Other Ambulatory Visit: Payer: Medicare Other

## 2020-09-13 ENCOUNTER — Other Ambulatory Visit: Payer: Self-pay

## 2020-09-13 ENCOUNTER — Encounter
Admission: RE | Admit: 2020-09-13 | Discharge: 2020-09-13 | Disposition: A | Discharge status: Home / Self Care | Payer: Medicare Other | Source: Ambulatory Visit | Attending: Surgery | Admitting: Surgery

## 2020-09-13 NOTE — Patient Instructions (Signed)
Your procedure is scheduled on: 09/27/20 Report to [place]. To find out your arrival time please call [phone] between 1PM - 3PM on 09/26/20.  Remember: Instructions that are not followed completely may result in serious medical risk, up to and including death, or upon the discretion of your surgeon and anesthesiologist your surgery may need to be rescheduled.     _X__ 1. Do not eat food after midnight the night before your procedure.                 No gum chewing or hard candies. You may drink clear liquids up to 2 hours                 before you are scheduled to arrive for your surgery- DO not drink clear                 liquids within 2 hours of the start of your surgery.                 Clear Liquids include:  water, apple juice without pulp, clear carbohydrate                 drink such as Clearfast or Gatorade, Black Coffee or Tea (Do not add                 anything to coffee or tea). Diabetics water only  __X__2.  On the morning of surgery brush your teeth with toothpaste and water, you                 may rinse your mouth with mouthwash if you wish.  Do not swallow any              toothpaste of mouthwash.     _X__ 3.  No Alcohol for 24 hours before or after surgery.   _X__ 4.  Do Not Smoke or use e-cigarettes For 24 Hours Prior to Your Surgery.                 Do not use any chewable tobacco products for at least 6 hours prior to                 surgery.  ____  5.  Bring all medications with you on the day of surgery if instructed.   __X__  6.  Notify your doctor if there is any change in your medical condition      (cold, fever, infections).     Do not wear jewelry, make-up, hairpins, clips or nail polish. Do not wear lotions, powders, or perfumes.  Do not shave 48 hours prior to surgery. Men may shave face and neck. Do not bring valuables to the hospital.    [facility] is not responsible for any belongings or  valuables.  Contacts, dentures/partials or body piercings may not be worn into surgery. Bring a case for your contacts, glasses or hearing aids, a denture cup will be supplied. Leave your suitcase in the car. After surgery it may be brought to your room. For patients admitted to the hospital, discharge time is determined by your treatment team.   Patients discharged the day of surgery will not be allowed to drive home.   Please read over the following fact sheets that you were given:   MRSA Information  __X__ Take these medicines the morning of surgery with A SIP OF WATER:  1. amLODipine (NORVASC) 10 MG tablet  2. HYDROcodone-acetaminophen (NORCO/VICODIN) 5-325 MG tablet IF NEEDED  3.   4.  5.  6.  ____ Fleet Enema (as directed)   __X__ Use CHG Soap/SAGE wipes as directed  ____ Use inhalers on the day of surgery  ____ Stop metformin/Janumet/Farxiga 2 days prior to surgery    ____ Take 1/2 of usual insulin dose the night before surgery. No insulin the morning          of surgery.   ____ Stop Blood Thinners Coumadin/Plavix/Xarelto/Pleta/Pradaxa/Eliquis/Effient/Aspirin  on   Or contact your Surgeon, Cardiologist or Medical Doctor regarding  ability to stop your blood thinners  __X__ Stop Anti-inflammatories 7 days before surgery such as Advil, Ibuprofen, Motrin,  BC or Goodies Powder, Naprosyn, Naproxen, Aleve, Aspirin    __X__ Stop all herbal supplements, fish oil or vitamin E until after surgery.    ____ Bring C-Pap to the hospital.

## 2020-09-14 ENCOUNTER — Other Ambulatory Visit: Payer: Self-pay | Admitting: *Deleted

## 2020-09-14 ENCOUNTER — Inpatient Hospital Stay: Payer: Medicare Other | Admitting: Oncology

## 2020-09-14 ENCOUNTER — Inpatient Hospital Stay: Payer: Medicare Other | Attending: Oncology | Admitting: Oncology

## 2020-09-14 ENCOUNTER — Encounter: Payer: Self-pay | Admitting: Oncology

## 2020-09-14 VITALS — BP 130/71 | HR 61 | Temp 98.3°F | Resp 16 | Ht 68.0 in | Wt 159.8 lb

## 2020-09-14 DIAGNOSIS — G4700 Insomnia, unspecified: Secondary | ICD-10-CM | POA: Insufficient documentation

## 2020-09-14 DIAGNOSIS — Z79899 Other long term (current) drug therapy: Secondary | ICD-10-CM | POA: Insufficient documentation

## 2020-09-14 DIAGNOSIS — I10 Essential (primary) hypertension: Secondary | ICD-10-CM | POA: Diagnosis not present

## 2020-09-14 DIAGNOSIS — R7989 Other specified abnormal findings of blood chemistry: Secondary | ICD-10-CM | POA: Diagnosis not present

## 2020-09-14 DIAGNOSIS — F17210 Nicotine dependence, cigarettes, uncomplicated: Secondary | ICD-10-CM | POA: Diagnosis not present

## 2020-09-14 DIAGNOSIS — D539 Nutritional anemia, unspecified: Secondary | ICD-10-CM | POA: Diagnosis not present

## 2020-09-14 DIAGNOSIS — M545 Low back pain, unspecified: Secondary | ICD-10-CM | POA: Insufficient documentation

## 2020-09-14 LAB — CBC WITH DIFFERENTIAL/PLATELET
Abs Immature Granulocytes: 0.01 10*3/uL (ref 0.00–0.07)
Basophils Absolute: 0 10*3/uL (ref 0.0–0.1)
Basophils Relative: 0 %
Eosinophils Absolute: 0.3 10*3/uL (ref 0.0–0.5)
Eosinophils Relative: 6 %
HCT: 41.3 % (ref 39.0–52.0)
Hemoglobin: 14.9 g/dL (ref 13.0–17.0)
Immature Granulocytes: 0 %
Lymphocytes Relative: 33 %
Lymphs Abs: 1.5 10*3/uL (ref 0.7–4.0)
MCH: 37.2 pg — ABNORMAL HIGH (ref 26.0–34.0)
MCHC: 36.1 g/dL — ABNORMAL HIGH (ref 30.0–36.0)
MCV: 103 fL — ABNORMAL HIGH (ref 80.0–100.0)
Monocytes Absolute: 0.4 10*3/uL (ref 0.1–1.0)
Monocytes Relative: 8 %
Neutro Abs: 2.4 10*3/uL (ref 1.7–7.7)
Neutrophils Relative %: 53 %
Platelets: 162 10*3/uL (ref 150–400)
RBC: 4.01 MIL/uL — ABNORMAL LOW (ref 4.22–5.81)
RDW: 12.3 % (ref 11.5–15.5)
WBC: 4.5 10*3/uL (ref 4.0–10.5)
nRBC: 0 % (ref 0.0–0.2)

## 2020-09-14 LAB — COMPREHENSIVE METABOLIC PANEL
ALT: 13 U/L (ref 0–44)
AST: 18 U/L (ref 15–41)
Albumin: 4.3 g/dL (ref 3.5–5.0)
Alkaline Phosphatase: 54 U/L (ref 38–126)
Anion gap: 9 (ref 5–15)
BUN: 8 mg/dL (ref 8–23)
CO2: 24 mmol/L (ref 22–32)
Calcium: 9.4 mg/dL (ref 8.9–10.3)
Chloride: 99 mmol/L (ref 98–111)
Creatinine, Ser: 0.78 mg/dL (ref 0.61–1.24)
GFR, Estimated: >60 mL/min (ref >60)
Glucose, Bld: 133 mg/dL — ABNORMAL HIGH (ref 70–99)
Potassium: 4 mmol/L (ref 3.5–5.1)
Sodium: 132 mmol/L — ABNORMAL LOW (ref 135–145)
Total Bilirubin: 0.7 mg/dL (ref 0.3–1.2)
Total Protein: 7.3 g/dL (ref 6.5–8.1)

## 2020-09-14 LAB — FERRITIN: Ferritin: 121 ng/mL (ref 24–336)

## 2020-09-14 LAB — VITAMIN B12: Vitamin B-12: 131 pg/mL — ABNORMAL LOW (ref 180–914)

## 2020-09-14 LAB — FOLATE: Folate: 8.9 ng/mL (ref >5.9)

## 2020-09-14 NOTE — Progress Notes (Signed)
Pt here to see if he needs phlebotomy. He is going to get surgery 5/5 for hernia. He has no complaints and he states that he has not seen any blisters since last visit.

## 2020-09-16 NOTE — Progress Notes (Signed)
Hematology/Oncology Consult note [facility]  Telephone:([phone] Fax:([phone]) [phone]  Patient Care Team: Idelle Crouch, MD as PCP - General (Internal Medicine)   Name of the patient: Shawn Gonzalez  [id number]  12/12/1955   Date of visit: 09/16/20  Diagnosis-porphyria cutanea tarda  Chief complaint/ Reason for visit-routine follow-up of porphyria cutanea tarda for possible phlebotomy  Heme/Onc history: Patient is a 65 year old male with a past medical history significant for hypertension, anxiety, chronic back pain and tobacco dependence. He has had on and off blistering of his bilateral hands and has seen dermatology in the past and was attributed to hematological disorder. Patient states that he has came to the cancer center many years ago and underwent phlebotomy at that time. He is unsure if he has hemochromatosis. Family history of any liver disease. He does report drinking alcohol sometimes he may have a few beers and 1 go sometimes he may go for days without drink. He also smokes 1 pack of cigarettes per day for the last 42 years.  Results of blood work from 12/10/2018 showed elevated ferritinof 772.HIV and hepatitisB andC testing was negative. HFE gene testing was negative.   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  Denies any skin blisters.  Appetite and weight have remained stable.  He still drinks about 2-3 beers nightly which  he states is better than what he was doing in the past when he is to drink about 10/day.  He has an upcoming hernia surgery on 09/27/2020  ECOG PS- 1 Pain scale- 0  Review of systems- Review of Systems  Constitutional: Positive for malaise/fatigue. Negative for chills, fever and weight loss.  HENT: Negative for congestion, ear discharge and nosebleeds.   Eyes: Negative for blurred vision.  Respiratory: Negative for cough, hemoptysis, sputum production, shortness of breath and wheezing.   Cardiovascular: Negative for chest pain, palpitations, orthopnea and claudication.  Gastrointestinal: Negative for abdominal pain, blood in stool, constipation, diarrhea, heartburn, melena, nausea and vomiting.  Genitourinary: Negative for dysuria, flank pain, frequency, hematuria and urgency.  Musculoskeletal: Negative for back pain, joint pain and myalgias.  Skin: Negative for rash.  Neurological: Negative for dizziness, tingling, focal weakness, seizures, weakness and headaches.  Endo/Heme/Allergies: Does not bruise/bleed easily.  Psychiatric/Behavioral: Negative for depression and suicidal ideas. The patient does not have insomnia.       Allergies  Allergen Reactions  . Celebrex [Celecoxib] Nausea Only     Past Medical History:  Diagnosis Date  . Anxiety   . Chronic low back pain   . Depression   . Elevated ferritin   . Erectile dysfunction   . History of chicken pox   . Hypertension   . Insomnia   . Porphyria cutanea tarda [facility]      Past Surgical History:  Procedure Laterality Date  . COLONOSCOPY WITH PROPOFOL N/A 10/31/2016   Procedure: COLONOSCOPY WITH PROPOFOL;  Surgeon: Vira Agar,  Gavin Pound, MD;  Location: [facility] ENDOSCOPY;  Service: Endoscopy;  Laterality: N/A;  . FINGER SURGERY Right    RT.INDEX  . HERNIA REPAIR    . MULTIPLE TOOTH EXTRACTIONS      Social History   Socioeconomic History  . Marital status: Married    Spouse name: Not on file  . Number of children: Not on file   . Years of education: Not on file  . Highest education level: Not on file  Occupational History  . Not on file  Tobacco Use  . Smoking status: Current Every Day Smoker    Packs/day: 1.00    Years: 42.00    Pack years: 42.00    Types: Cigarettes  . Smokeless tobacco: Never Used  Vaping Use  . Vaping Use: Never used  Substance and Sexual Activity  . Alcohol use: Yes    Alcohol/week: 18.0 standard drinks    Types: 18 Cans of beer per week  . Drug use: No  . Sexual activity: Not on file  Other Topics Concern  . Not on file  Social History Narrative  . Not on file   Social Determinants of Health   Financial Resource Strain: Not on file  Food Insecurity: Not on file  Transportation Needs: Not on file  Physical Activity: Not on file  Stress: Not on file  Social Connections: Not on file  Intimate Partner Violence: Not on file    History reviewed. No pertinent family history.   Current Outpatient Medications:  .  amLODipine (NORVASC) 10 MG tablet, Take 10 mg by mouth daily., Disp: , Rfl:  .  bisoprolol-hydrochlorothiazide (ZIAC) 5-6.25 MG tablet, Take 1 tablet by mouth daily., Disp: , Rfl:  .  cyclobenzaprine (FLEXERIL) 10 MG tablet, Take 10 mg by mouth 3 (three) times daily as needed for muscle spasms. , Disp: , Rfl:  .  HYDROcodone-acetaminophen (NORCO/VICODIN) 5-325 MG tablet, Take 1 tablet by mouth every 6 (six) hours as needed for moderate pain., Disp: , Rfl:  .  lisinopril (PRINIVIL,ZESTRIL) 40 MG tablet, Take 40 mg by mouth daily., Disp: , Rfl:  .  ALPRAZolam (XANAX) 0.25 MG tablet, Take 0.25 mg by mouth 2 (two) times daily as needed for anxiety or sleep.  (Patient not taking: No sig reported), Disp: , Rfl:   Physical exam:  Vitals:   09/14/20 1052  BP: 130/71  Pulse: 61  Resp: 16  Temp: 98.3 F (36.8 C)  TempSrc: Oral  Weight: 159 lb 12.8 oz (72.5 kg)  Height: 5\' 8"  (1.727 m)   Physical Exam Constitutional:      General: He is not in acute  distress. Cardiovascular:     Rate and Rhythm: Normal rate and regular rhythm.     Heart sounds: Normal heart sounds.  Pulmonary:     Effort: Pulmonary effort is normal.     Breath sounds: Normal breath sounds.  Skin:    General: Skin is warm and dry.  Neurological:     Mental Status: He is alert and oriented to person, place, and time.      CMP Latest Ref Rng & Units 09/14/2020  Glucose 70 - 99 mg/dL 133(H)  BUN 8 - 23 mg/dL 8  Creatinine 0.61 - 1.24 mg/dL 0.78  Sodium 135 - 145 mmol/L 132(L)  Potassium 3.5 - 5.1 mmol/L 4.0  Chloride 98 - 111 mmol/L 99  CO2 22 - 32 mmol/L 24  Calcium 8.9 - 10.3 mg/dL 9.4  Total Protein 6.5 - 8.1 g/dL 7.3  Total Bilirubin 0.3 - 1.2 mg/dL 0.7  Alkaline Phos 38 - 126 U/L 54  AST 15 - 41 U/L 18  ALT 0 - 44 U/L 13   CBC Latest Ref Rng & Units 09/14/2020  WBC 4.0 - 10.5 K/uL 4.5  Hemoglobin 13.0 - 17.0 g/dL 14.9  Hematocrit 39.0 - 52.0 % 41.3  Platelets 150 - 400 K/uL 162    Assessment and plan- Patient is a 65 y.o. male with history of porphyria cutanea tarda here for possible phlebotomy  I have again encouraged the patient to quit alcohol altogether as it can worsen ferritin levels as well as skin toxicity due to porphyria cutanea tarda.  His ferritin came back at 121.  Goal ferritin is less than 100.  He would therefore need weekly phlebotomies until ferritin comes down to less than 100 LFTs are presently normal.  B12 levels low at 131 likely secondary to ongoing alcohol intake.  He has been advised to take oral B12 1000 mcg daily.  Repeat CBC CMP ferritin iron studies in 3 in 6 months and I will see him in 6 months   Visit Diagnosis 1. Elevated ferritin level   2. Macrocytic anemia   3. Porphyria cutanea tarda ([place])      Dr. Randa Evens, MD, MPH [facility] [phone] 09/16/2020 11:57 AM

## 2020-09-17 NOTE — Progress Notes (Signed)
09-17-20 called pt to discuss lab results and recommendations, pt did not answer, and VM not set up

## 2020-09-18 ENCOUNTER — Other Ambulatory Visit: Payer: Medicare Other

## 2020-09-18 ENCOUNTER — Encounter
Admission: RE | Admit: 2020-09-18 | Discharge: 2020-09-18 | Disposition: A | Discharge status: Home / Self Care | Payer: Medicare Other | Source: Ambulatory Visit | Attending: Surgery | Admitting: Surgery

## 2020-09-18 ENCOUNTER — Other Ambulatory Visit: Payer: Self-pay

## 2020-09-18 DIAGNOSIS — Z01810 Encounter for preprocedural cardiovascular examination: Secondary | ICD-10-CM | POA: Insufficient documentation

## 2020-09-20 ENCOUNTER — Encounter
Admission: RE | Disposition: A | Discharge status: Home / Self Care | Payer: Self-pay | Source: Home / Self Care | Attending: Surgery

## 2020-09-20 SURGERY — REPAIR, HERNIA, INGUINAL, ROBOT-ASSISTED, LAPAROSCOPIC, USING MESH
Anesthesia: General | Site: Groin | Laterality: Left

## 2020-09-27 ENCOUNTER — Ambulatory Visit
Admission: RE | Admit: 2020-09-27 | Discharge: 2020-09-27 | Disposition: A | Discharge status: Home / Self Care | Payer: Medicare Other | Attending: Surgery | Admitting: Surgery

## 2020-09-27 ENCOUNTER — Encounter
Admission: RE | Disposition: A | Discharge status: Home / Self Care | Payer: Self-pay | Source: Home / Self Care | Attending: Surgery

## 2020-09-27 ENCOUNTER — Ambulatory Visit: Payer: Medicare Other | Admitting: Anesthesiology

## 2020-09-27 ENCOUNTER — Encounter: Payer: Self-pay | Admitting: Surgery

## 2020-09-27 DIAGNOSIS — K4090 Unilateral inguinal hernia, without obstruction or gangrene, not specified as recurrent: Secondary | ICD-10-CM | POA: Diagnosis not present

## 2020-09-27 DIAGNOSIS — F17210 Nicotine dependence, cigarettes, uncomplicated: Secondary | ICD-10-CM | POA: Diagnosis not present

## 2020-09-27 HISTORY — PX: XI ROBOTIC ASSISTED INGUINAL HERNIA REPAIR WITH MESH: SHX6706

## 2020-09-27 SURGERY — REPAIR, HERNIA, INGUINAL, ROBOT-ASSISTED, LAPAROSCOPIC, USING MESH
Anesthesia: General | Laterality: Left

## 2020-09-27 MED ORDER — EPHEDRINE SULFATE 50 MG/ML IJ SOLN
INTRAMUSCULAR | Status: DC | PRN
  Administered 2020-09-27: 10 mg via INTRAVENOUS

## 2020-09-27 MED ORDER — ONDANSETRON HCL 4 MG/2ML IJ SOLN
INTRAMUSCULAR | Status: DC | PRN
  Administered 2020-09-27: 4 mg via INTRAVENOUS

## 2020-09-27 MED ORDER — ACETAMINOPHEN 500 MG PO TABS: 1000.0000 mg | ORAL_TABLET | ORAL | Status: AC

## 2020-09-27 MED ORDER — PROMETHAZINE HCL 25 MG/ML IJ SOLN
INTRAMUSCULAR | Status: AC
  Filled 2020-09-27: qty 1

## 2020-09-27 MED ORDER — CHLORHEXIDINE GLUCONATE 0.12 % MT SOLN
OROMUCOSAL | Status: AC
  Administered 2020-09-27: 15 mL via OROMUCOSAL
  Filled 2020-09-27: qty 15

## 2020-09-27 MED ORDER — ACETAMINOPHEN 325 MG PO TABS: 650.0000 mg | ORAL_TABLET | Freq: Three times a day (TID) | ORAL | 0 refills | Status: AC | PRN

## 2020-09-27 MED ORDER — BUPIVACAINE-EPINEPHRINE (PF) 0.5% -1:200000 IJ SOLN
INTRAMUSCULAR | Status: AC
  Filled 2020-09-27: qty 30

## 2020-09-27 MED ORDER — ACETAMINOPHEN 325 MG PO TABS
325.0000 mg | ORAL_TABLET | ORAL | Status: DC | PRN
Start: 2020-09-27 — End: 2020-09-27

## 2020-09-27 MED ORDER — HYDROCODONE-ACETAMINOPHEN 5-325 MG PO TABS: 1.0000 | ORAL_TABLET | Freq: Four times a day (QID) | ORAL | 0 refills | Status: AC | PRN

## 2020-09-27 MED ORDER — GABAPENTIN 300 MG PO CAPS
ORAL_CAPSULE | ORAL | Status: AC
  Administered 2020-09-27: 300 mg via ORAL
  Filled 2020-09-27: qty 1

## 2020-09-27 MED ORDER — LACTATED RINGERS IV SOLN: INTRAVENOUS | Status: DC

## 2020-09-27 MED ORDER — PROPOFOL 10 MG/ML IV BOLUS
INTRAVENOUS | Status: AC
  Filled 2020-09-27: qty 20

## 2020-09-27 MED ORDER — ROCURONIUM BROMIDE 100 MG/10ML IV SOLN
INTRAVENOUS | Status: DC | PRN
  Administered 2020-09-27: 50 mg via INTRAVENOUS

## 2020-09-27 MED ORDER — HYDROCODONE-ACETAMINOPHEN 7.5-325 MG PO TABS
1.0000 | ORAL_TABLET | Freq: Once | ORAL | Status: DC | PRN
Start: 2020-09-27 — End: 2020-09-27

## 2020-09-27 MED ORDER — SUGAMMADEX SODIUM 200 MG/2ML IV SOLN
INTRAVENOUS | Status: DC | PRN
  Administered 2020-09-27: 200 mg via INTRAVENOUS

## 2020-09-27 MED ORDER — BUPIVACAINE LIPOSOME 1.3 % IJ SUSP
INTRAMUSCULAR | Status: AC
  Filled 2020-09-27: qty 20

## 2020-09-27 MED ORDER — CEFAZOLIN SODIUM-DEXTROSE 2-4 GM/100ML-% IV SOLN
INTRAVENOUS | Status: AC
  Filled 2020-09-27: qty 100

## 2020-09-27 MED ORDER — DEXAMETHASONE SODIUM PHOSPHATE 10 MG/ML IJ SOLN
INTRAMUSCULAR | Status: DC | PRN
  Administered 2020-09-27: 10 mg via INTRAVENOUS

## 2020-09-27 MED ORDER — ONDANSETRON HCL 4 MG/2ML IJ SOLN
INTRAMUSCULAR | Status: AC
  Filled 2020-09-27: qty 2

## 2020-09-27 MED ORDER — DOCUSATE SODIUM 100 MG PO CAPS: 100.0000 mg | ORAL_CAPSULE | Freq: Two times a day (BID) | ORAL | 0 refills | Status: AC | PRN

## 2020-09-27 MED ORDER — FAMOTIDINE 20 MG PO TABS: 20.0000 mg | ORAL_TABLET | Freq: Once | ORAL | Status: AC

## 2020-09-27 MED ORDER — HYDROCODONE-ACETAMINOPHEN 5-325 MG PO TABS
ORAL_TABLET | ORAL | Status: AC
  Administered 2020-09-27: 1 via ORAL
  Filled 2020-09-27: qty 1

## 2020-09-27 MED ORDER — SODIUM CHLORIDE FLUSH 0.9 % IV SOLN
INTRAVENOUS | Status: AC
  Filled 2020-09-27: qty 10

## 2020-09-27 MED ORDER — ACETAMINOPHEN 160 MG/5ML PO SOLN
325.0000 mg | ORAL | Status: DC | PRN
  Filled 2020-09-27: qty 20.3

## 2020-09-27 MED ORDER — FENTANYL CITRATE (PF) 100 MCG/2ML IJ SOLN
INTRAMUSCULAR | Status: DC | PRN
  Administered 2020-09-27 (×2): 50 ug via INTRAVENOUS
  Administered 2020-09-27: 100 ug via INTRAVENOUS

## 2020-09-27 MED ORDER — FENTANYL CITRATE (PF) 100 MCG/2ML IJ SOLN
INTRAMUSCULAR | Status: AC
  Filled 2020-09-27: qty 2

## 2020-09-27 MED ORDER — LACTATED RINGERS IV SOLN: INTRAVENOUS | Status: DC | PRN

## 2020-09-27 MED ORDER — ACETAMINOPHEN 500 MG PO TABS
ORAL_TABLET | ORAL | Status: AC
  Administered 2020-09-27: 1000 mg via ORAL
  Filled 2020-09-27: qty 2

## 2020-09-27 MED ORDER — MIDAZOLAM HCL 2 MG/2ML IJ SOLN
INTRAMUSCULAR | Status: AC
  Filled 2020-09-27: qty 2

## 2020-09-27 MED ORDER — ORAL CARE MOUTH RINSE: 15.0000 mL | Freq: Once | OROMUCOSAL | Status: AC

## 2020-09-27 MED ORDER — BUPIVACAINE LIPOSOME 1.3 % IJ SUSP
INTRAMUSCULAR | Status: DC | PRN
  Administered 2020-09-27: 20 mL

## 2020-09-27 MED ORDER — CHLORHEXIDINE GLUCONATE CLOTH 2 % EX PADS: 6.0000 | MEDICATED_PAD | Freq: Once | CUTANEOUS | Status: DC

## 2020-09-27 MED ORDER — MIDAZOLAM HCL 2 MG/2ML IJ SOLN
INTRAMUSCULAR | Status: DC | PRN
  Administered 2020-09-27: 2 mg via INTRAVENOUS

## 2020-09-27 MED ORDER — BUPIVACAINE-EPINEPHRINE 0.5% -1:200000 IJ SOLN
INTRAMUSCULAR | Status: DC | PRN
  Administered 2020-09-27: 13 mL

## 2020-09-27 MED ORDER — FENTANYL CITRATE (PF) 100 MCG/2ML IJ SOLN
25.0000 ug | INTRAMUSCULAR | Status: DC | PRN
  Administered 2020-09-27: 25 ug via INTRAVENOUS
  Administered 2020-09-27: 50 ug via INTRAVENOUS
  Administered 2020-09-27: 25 ug via INTRAVENOUS
  Administered 2020-09-27: 50 ug via INTRAVENOUS

## 2020-09-27 MED ORDER — PROMETHAZINE HCL 25 MG/ML IJ SOLN: 6.2500 mg | INTRAMUSCULAR | Status: DC | PRN

## 2020-09-27 MED ORDER — LIDOCAINE HCL (CARDIAC) PF 100 MG/5ML IV SOSY
PREFILLED_SYRINGE | INTRAVENOUS | Status: DC | PRN
  Administered 2020-09-27: 100 mg via INTRAVENOUS

## 2020-09-27 MED ORDER — CEFAZOLIN SODIUM-DEXTROSE 2-4 GM/100ML-% IV SOLN
2.0000 g | INTRAVENOUS | Status: AC
  Administered 2020-09-27: 2 g via INTRAVENOUS

## 2020-09-27 MED ORDER — CHLORHEXIDINE GLUCONATE 0.12 % MT SOLN: 15.0000 mL | Freq: Once | OROMUCOSAL | Status: AC

## 2020-09-27 MED ORDER — FAMOTIDINE 20 MG PO TABS
ORAL_TABLET | ORAL | Status: AC
  Administered 2020-09-27: 20 mg via ORAL
  Filled 2020-09-27: qty 1

## 2020-09-27 MED ORDER — PROPOFOL 10 MG/ML IV BOLUS
INTRAVENOUS | Status: DC | PRN
  Administered 2020-09-27: 200 mg via INTRAVENOUS

## 2020-09-27 MED ORDER — SUGAMMADEX SODIUM 500 MG/5ML IV SOLN
INTRAVENOUS | Status: AC
  Filled 2020-09-27: qty 5

## 2020-09-27 MED ORDER — GABAPENTIN 300 MG PO CAPS: 300.0000 mg | ORAL_CAPSULE | ORAL | Status: AC

## 2020-09-27 SURGICAL SUPPLY — 48 items
ADH SKN CLS APL DERMABOND .7 (GAUZE/BANDAGES/DRESSINGS) ×1
APL PRP STRL LF DISP 70% ISPRP (MISCELLANEOUS) ×1
BAG INFUSER PRESSURE 100CC (MISCELLANEOUS) IMPLANT
BLADE SURG SZ11 CARB STEEL (BLADE) ×2 IMPLANT
BNDG GAUZE 4.5X4.1 6PLY STRL (MISCELLANEOUS) ×2 IMPLANT
CANISTER SUCT 1200ML W/VALVE (MISCELLANEOUS) IMPLANT
CHLORAPREP W/TINT 26 (MISCELLANEOUS) ×2 IMPLANT
COVER TIP SHEARS 8 DVNC (MISCELLANEOUS) ×1 IMPLANT
COVER TIP SHEARS 8MM DA VINCI (MISCELLANEOUS) ×1
COVER WAND RF STERILE (DRAPES) IMPLANT
DEFOGGER SCOPE WARMER CLEARIFY (MISCELLANEOUS) ×2 IMPLANT
DERMABOND ADVANCED (GAUZE/BANDAGES/DRESSINGS) ×1
DERMABOND ADVANCED .7 DNX12 (GAUZE/BANDAGES/DRESSINGS) ×1 IMPLANT
DRAPE ARM DVNC X/XI (DISPOSABLE) ×3 IMPLANT
DRAPE COLUMN DVNC XI (DISPOSABLE) ×1 IMPLANT
DRAPE DA VINCI XI ARM (DISPOSABLE) ×3
DRAPE DA VINCI XI COLUMN (DISPOSABLE) ×1
ELECT CAUTERY BLADE 6.4 (BLADE) IMPLANT
ELECT REM PT RETURN 9FT ADLT (ELECTROSURGICAL) ×2
ELECTRODE REM PT RTRN 9FT ADLT (ELECTROSURGICAL) ×1 IMPLANT
GLOVE SURG SYN 6.5 ES PF (GLOVE) ×6 IMPLANT
GLOVE SURG UNDER POLY LF SZ7 (GLOVE) ×6 IMPLANT
GOWN STRL REUS W/ TWL LRG LVL3 (GOWN DISPOSABLE) ×3 IMPLANT
GOWN STRL REUS W/TWL LRG LVL3 (GOWN DISPOSABLE) ×6
IRRIGATOR SUCT 8 DISP DVNC XI (IRRIGATION / IRRIGATOR) IMPLANT
IRRIGATOR SUCTION 8MM XI DISP (IRRIGATION / IRRIGATOR)
IV NS 1000ML (IV SOLUTION)
IV NS 1000ML BAXH (IV SOLUTION) IMPLANT
LABEL OR SOLS (LABEL) IMPLANT
MANIFOLD NEPTUNE II (INSTRUMENTS) ×2 IMPLANT
MESH 3DMAX 4X6 LT LRG (Mesh General) ×1 IMPLANT
MESH 3DMAX MID 4X6 LT LRG (Mesh General) ×1 IMPLANT
NEEDLE HYPO 22GX1.5 SAFETY (NEEDLE) ×2 IMPLANT
NEEDLE INSUFFLATION 14GA 120MM (NEEDLE) ×2 IMPLANT
OBTURATOR OPTICAL STANDARD 8MM (TROCAR) ×1
OBTURATOR OPTICAL STND 8 DVNC (TROCAR) ×1
OBTURATOR OPTICALSTD 8 DVNC (TROCAR) ×1 IMPLANT
PACK LAP CHOLECYSTECTOMY (MISCELLANEOUS) ×2 IMPLANT
PENCIL ELECTRO HAND CTR (MISCELLANEOUS) ×2 IMPLANT
SEAL CANN UNIV 5-8 DVNC XI (MISCELLANEOUS) ×3 IMPLANT
SEAL XI 5MM-8MM UNIVERSAL (MISCELLANEOUS) ×3
SET TUBE SMOKE EVAC HIGH FLOW (TUBING) ×2 IMPLANT
SOLUTION ELECTROLUBE (MISCELLANEOUS) ×2 IMPLANT
SUT MNCRL AB 4-0 PS2 18 (SUTURE) ×2 IMPLANT
SUT VIC AB 2-0 SH 27 (SUTURE) ×2
SUT VIC AB 2-0 SH 27XBRD (SUTURE) ×1 IMPLANT
SUT VLOC 90 6 CV-15 VIOLET (SUTURE) ×4 IMPLANT
SYR 30ML LL (SYRINGE) ×2 IMPLANT

## 2020-09-27 NOTE — Discharge Instructions (Signed)
Hernia repair, Care After This sheet gives you information about how to care for yourself after your procedure. Your health care provider may also give you more specific instructions. If you have problems or questions, contact your health care provider. What can I expect after the procedure? After your procedure, it is common to have the following:  Pain in your abdomen, especially in the incision areas. You will be given medicine to control the pain.  Tiredness. This is a normal part of the recovery process. Your energy level will return to normal over the next several weeks.  Changes in your bowel movements, such as constipation or needing to go more often. Talk with your health care provider about how to manage this. Follow these instructions at home: Medicines   tylenol  as needed for discomfort.      Use narcotics, if prescribed, only when tylenol is not enough to control pain.   325-650mg  every 8hrs to max of 3000mg /24hrs (including the 325mg  in every norco dose) for the tylenol.     PLEASE RECORD NUMBER OF PILLS TAKEN UNTIL NEXT FOLLOW UP APPT.  THIS WILL HELP DETERMINE HOW READY YOU ARE TO BE RELEASED FROM ANY ACTIVITY RESTRICTIONS  Do not drive or use heavy machinery while taking prescription pain medicine.  Do not drink alcohol while taking prescription pain medicine.  Incision care     Follow instructions from your health care provider about how to take care of your incision areas. Make sure you: ? Keep your incisions clean and dry. ? Wash your hands with soap and water before and after applying medicine to the areas, and before and after changing your bandage (dressing). If soap and water are not available, use hand sanitizer. ? Change your dressing as told by your health care provider. ? Leave stitches (sutures), skin glue, or adhesive strips in place. These skin closures may need to stay in place for 2 weeks or longer. If adhesive strip edges start to loosen and curl  up, you may trim the loose edges. Do not remove adhesive strips completely unless your health care provider tells you to do that.  Do not wear tight clothing over the incisions. Tight clothing may rub and irritate the incision areas, which may cause the incisions to open.  Do not take baths, swim, or use a hot tub until your health care provider approves. OK TO SHOWER IN 24HRS.    Check your incision area every day for signs of infection. Check for: ? More redness, swelling, or pain. ? More fluid or blood. ? Warmth. ? Pus or a bad smell. Activity  Avoid lifting anything that is heavier than 10 lb (4.5 kg) for 2 weeks or until your health care provider says it is okay.  No pushing/pulling greater than 30lbs  You may resume normal activities as told by your health care provider. Ask your health care provider what activities are safe for you.  Take rest breaks during the day as needed. Eating and drinking  Follow instructions from your health care provider about what you can eat after surgery.  To prevent or treat constipation while you are taking prescription pain medicine, your health care provider may recommend that you: ? Drink enough fluid to keep your urine clear or pale yellow. ? Take over-the-counter or prescription medicines. ? Eat foods that are high in fiber, such as fresh fruits and vegetables, whole grains, and beans. ? Limit foods that are high in fat and processed sugars, such as  fried and sweet foods. General instructions  Ask your health care provider when you will need an appointment to get your sutures or staples removed.  Keep all follow-up visits as told by your health care provider. This is important. Contact a health care provider if:  You have more redness, swelling, or pain around your incisions.  You have more fluid or blood coming from the incisions.  Your incisions feel warm to the touch.  You have pus or a bad smell coming from your incisions or  your dressing.  You have a fever.  You have an incision that breaks open (edges not staying together) after sutures or staples have been removed. Get help right away if:  You develop a rash.  You have chest pain or difficulty breathing.  You have pain or swelling in your legs.  You feel light-headed or you faint.  Your abdomen swells (becomes distended).  You have nausea or vomiting.  You have blood in your stool (feces). This information is not intended to replace advice given to you by your health care provider. Make sure you discuss any questions you have with your health care provider. Document Released: 11/29/2004 Document Revised: 01/29/2018 Document Reviewed: 02/11/2016 Elsevier Interactive Patient Education  2019 [place]   1) The drugs that you were given will stay in your system until tomorrow so for the next 24 hours you should not:  A) Drive an automobile B) Make any legal decisions C) Drink any alcoholic beverage   2) You may resume regular meals tomorrow.  Today it is better to start with liquids and gradually work up to solid foods.  You may eat anything you prefer, but it is better to start with liquids, then soup and crackers, and gradually work up to solid foods.   3) Please notify your doctor immediately if you have any unusual bleeding, trouble breathing, redness and pain at the surgery site, drainage, fever, or pain not relieved by medication.    4) Additional Instructions:  Do not remove green band for 4 days  Please contact your physician with any problems or Same Day Surgery at [phone], Monday through Friday 6 am to 4 pm, or [place] at [facility] number at [phone].

## 2020-09-27 NOTE — H&P (Signed)
Subjective:   CC: Non-recurrent unilateral inguinal hernia without obstruction or gangrene [K40.90]  HPI: Shawn Gonzalez is a 65 y.o. male who was referred by Idelle Crouch, MD for evaluation of above. Symptoms were first noted 1 year ago. Asymptomatic. Exacerbated by cough. Lump is reducible.   Past Medical History: has a past medical history of Anxiety, Chronic back pain, Depression, Erectile dysfunction, Essential hypertension, Hemochromatosis, History of chicken pox, Insomnia, and Tobacco dependence.  Past Surgical History:  Past Surgical History:  Procedure Laterality Date  . COLONOSCOPY 10/31/2016  Adenomatous Polyp: CBF 10/2021  . EXTRACTION TEETH  . Right index finger surgery Right  inguinal hernia repair as child, RIGHT  Family History: family history includes Diabetes type II in his father and mother; No Known Problems in his brother, brother, brother, brother, brother, sister, and sister.  Social History: reports that he has been smoking cigarettes. He has a 42.00 pack-year smoking history. He has never used smokeless tobacco. He reports current alcohol use. He reports that he does not use drugs.  Current Medications: has a current medication list which includes the following prescription(s): alprazolam, amlodipine, bisoprolol-hydrochlorothiazide, cyclobenzaprine, hydrocodone-acetaminophen, lisinopril, and sildenafil.  Allergies:  Allergies as of 04/09/2020 - Reviewed 04/09/2020  Allergen Reaction Noted  . Celebrex [celecoxib] Nausea 02/07/2016   ROS:  A 15 point review of systems was performed and pertinent positives and negatives noted in HPI  Objective:    BP 146/82  Pulse 71  Ht 168.9 cm (5' 6.5")  Wt 68.9 kg (152 lb)  BMI 24.17 kg/m   Constitutional : alert, appears stated age, cooperative and no distress  Lymphatics/Throat: no asymmetry, masses, or scars  Respiratory: clear to auscultation bilaterally  Cardiovascular: regular rate and rhythm   Gastrointestinal: soft, non-tender; bowel sounds normal; no masses, no organomegaly. inguinal hernia noted. small, reducible, no overlying skin changes and LEFT, nothing obvious on right  Musculoskeletal: Steady gait and movement  Skin: Cool and moist  Psychiatric: Normal affect, non-agitated, not confused    LABS:  n/a   RADS: n/a Assessment:    Non-recurrent unilateral inguinal hernia without obstruction or gangrene [K40.90] LEFT  smoking  Plan:    1. Non-recurrent unilateral inguinal hernia without obstruction or gangrene [K40.90]  Discussed the risk of surgery including recurrence, which can be up to 50% in the case of incisional or complex hernias, possible use of prosthetic materials (mesh) and the increased risk of mesh infxn if used, bleeding, chronic pain, post-op infxn, post-op SBO or ileus, and possible re-operation to address said risks. The risks of general anesthetic, if used, includes MI, CVA, sudden death or even reaction to anesthetic medications also discussed. Alternatives include continued observation. Benefits include possible symptom relief, prevention of incarceration, strangulation, enlargement in size over time, and the risk of emergency surgery in the face of strangulation.   Typical post-op recovery time of 3-5 days with 2 weeks of activity restrictions were also discussed.  ED return precautions given for sudden increase in pain, size of hernia with accompanying fever, nausea, and/or vomiting.  The patient verbalized understanding and all questions were answered to the patient's satisfaction.  encouarged patient to stop smoking to minimize periop complications. No verbal commitment today.  2. Patient has elected to proceed with surgical treatment. Procedure will be scheduled. Written consent was obtained.. LEFT, robotic assisted laparoscopic

## 2020-09-27 NOTE — Anesthesia Preprocedure Evaluation (Signed)
Anesthesia Evaluation  Patient identified by MRN, date of birth, ID band Patient awake    Reviewed: Allergy & Precautions, H&P , NPO status , reviewed documented beta blocker date and time   Airway Mallampati: II  TM Distance: >3 FB Neck ROM: limited    Dental  (+) Upper Dentures, Partial Lower, Poor Dentition, Chipped, Missing   Pulmonary Current Smoker and Patient abstained from smoking.,    Pulmonary exam normal        Cardiovascular hypertension, Normal cardiovascular exam     Neuro/Psych PSYCHIATRIC DISORDERS Anxiety Depression    GI/Hepatic neg GERD  ,(+)     substance abuse  alcohol use,   Endo/Other    Renal/GU      Musculoskeletal  (+) Arthritis ,   Abdominal   Peds  Hematology  (+) Blood dyscrasia, anemia , Non-inducible porphyria   Anesthesia Other Findings Past Medical History: No date: Anxiety No date: Chronic low back pain No date: Depression No date: Elevated ferritin No date: Erectile dysfunction No date: History of chicken pox No date: Hypertension No date: Insomnia No date: Porphyria cutanea tarda [facility]) Past Surgical History: 10/31/2016: COLONOSCOPY WITH PROPOFOL; N/A     Comment:  Procedure: COLONOSCOPY WITH PROPOFOL;  Surgeon: Manya Silvas, MD;  Location: [facility] ENDOSCOPY;  Service:               Endoscopy;  Laterality: N/A; No date: FINGER SURGERY; Right     Comment:  RT.INDEX No date: HERNIA REPAIR No date: MULTIPLE TOOTH EXTRACTIONS BMI    Body Mass Index: 24.30 kg/m     Reproductive/Obstetrics                             Anesthesia Physical Anesthesia Plan  ASA: III  Anesthesia Plan: General ETT   Post-op Pain Management:    Induction: Intravenous  PONV Risk Score and Plan: 2 and Ondansetron, Treatment may vary due to age or medical condition and Midazolam  Airway Management Planned: Oral ETT  Additional Equipment:    Intra-op Plan:   Post-operative Plan: Extubation in OR  Informed Consent: I have reviewed the patients History and Physical, chart, labs and discussed the procedure including the risks, benefits and alternatives for the proposed anesthesia with the patient or authorized representative who has indicated his/her understanding and acceptance.     Dental Advisory Given  Plan Discussed with: CRNA  Anesthesia Plan Comments:         Anesthesia Quick Evaluation

## 2020-09-27 NOTE — Interval H&P Note (Signed)
No change 

## 2020-09-27 NOTE — Op Note (Signed)
Preoperative diagnosis: left, reducible, initial inguinal Hernia.  Postoperative diagnosis: left, reducible, initial inguinal Hernia  Procedure: Robotic assisted laparoscopic left inguinal hernia repair with mesh  Anesthesia: General  Surgeon: Dr. Lysle Pearl  Wound Classification: Clean  Specimen: none  Complications: None  Estimated Blood Loss: 63mL   Indications:  inguinal hernia. Repair was indicated to avoid complications of incarceration, obstruction and pain, and a prosthetic mesh repair was elected.  See H&P for further details.  Findings: 1. Vas Deferens and cord structures identified and preserved 2. Bard 3D max medium weight mesh used for repair 3. Adequate hemostasis achieved  Description of procedure: The patient was taken to the operating room. A time-out was completed verifying correct patient, procedure, site, positioning, and implant(s) and/or special equipment prior to beginning this procedure.  Area was prepped and draped in the usual sterile fashion. An incision was marked 20 cm above the pubic tubercle, slightly above the umbilicus  Scrotum wrapped in Kerlix roll.  Veress needle inserted at palmer's point.  Saline drop test noted to be positive with gradual increase in pressure after initiation of gas insufflation.  15 mm of pressure was achieved prior to removing the Veress needle and then placing a 8 mm port via the Optiview technique through the supraumbilical site.  Inspection of the area afterwards noted no injury to the surrounding organs during insertion of the needle and the port.  2 port sites were marked 8 cm to the lateral sides of the initial port, and a 8 mm robotic port was placed on the left side, another 8 mm robotic port on the right side under direct supervision.  Local anesthesia  infused to the preplanned incision sites prior to insertion of the port.  The [place] was then brought into the operative field and docked to the  ports.  Examination of the abdominal cavity noted a left inguinal hernia.  A peritoneal flap was created approximately 8cm cephalad to the defect by using scissors with electrocautery.  Dissection was carried down towards the pubic tubercle, developing the myopectineal orifice view.  Laterally the flap was carried towards the ASIS.  Small hernia sac was noted, which carefully dissected away from the adjacent tissues to be fully reduced out of hernia cavity.  Any bleeding was controlled with combination of electrocautery and manual pressure.    After confirming adequate dissection and the peritoneal reflection completely down and away from the cord structures, a Large Bard 3DMax medium weight mesh was placed within the anterior abdominal wall, secured in place using 2-0 Vicryl on an SH needle immediately above the pubic tubercle.  After noting proper placement of the mesh with the peritoneal reflection deep to it, the previously created peritoneal flap was secured back up to the anterior abdominal wall using running 3-0 V-Lock.  Both needles were then removed out of the abdominal cavity, Xi platform undocked from the ports and removed off of operative field.  exparel infused as ilioinguinal block.  Abdomen then desufflated and ports removed. All the skin incisions were then closed with a subcuticular stitch of Monocryl 4-0. Dermabond was applied. The testis was gently pulled down into its anatomic position in the scrotum.  The patient tolerated the procedure well and was taken to the postanesthesia care unit in stable condition. Sponge and instrument count correct at end of procedure.

## 2020-09-27 NOTE — Transfer of Care (Signed)
Immediate Anesthesia Transfer of Care Note  Patient: Shawn Gonzalez  Procedure(s) Performed: XI ROBOTIC ASSISTED INGUINAL HERNIA REPAIR WITH MESH (Left )  Patient Location: PACU  Anesthesia Type:General  Level of Consciousness: awake and oriented  Airway & Oxygen Therapy: Patient Spontanous Breathing and Patient connected to face mask oxygen  Post-op Assessment: Report given to RN and Post -op Vital signs reviewed and stable  Post vital signs: Reviewed and stable  Last Vitals:  Vitals Value Taken Time  BP 143/82 09/27/20 0915  Temp    Pulse 81 09/27/20 0916  Resp 11 09/27/20 0916  SpO2 100 % 09/27/20 0916  Vitals shown include unvalidated device data.  Last Pain:  Vitals:   09/27/20 0618  TempSrc: Temporal  PainSc: 0-No pain         Complications: No complications documented.

## 2020-09-27 NOTE — Anesthesia Postprocedure Evaluation (Signed)
Anesthesia Post Note  Patient: Shawn Gonzalez  Procedure(s) Performed: XI ROBOTIC ASSISTED INGUINAL HERNIA REPAIR WITH MESH (Left )  Patient location during evaluation: PACU Anesthesia Type: General Level of consciousness: awake and alert Pain management: pain level controlled Vital Signs Assessment: post-procedure vital signs reviewed and stable Respiratory status: spontaneous breathing, nonlabored ventilation and respiratory function stable Cardiovascular status: blood pressure returned to baseline and stable Postop Assessment: no apparent nausea or vomiting Anesthetic complications: no   No complications documented.   Last Vitals:  Vitals:   09/27/20 1015 09/27/20 1113  BP: 116/68 (!) 117/57  Pulse: 61 61  Resp: 10 16  Temp:    SpO2: 96% 98%    Last Pain:  Vitals:   09/27/20 1113  TempSrc:   PainSc: 5                  Peta Peachey Harvie Heck

## 2020-09-28 ENCOUNTER — Encounter: Payer: Self-pay | Admitting: Surgery

## 2020-10-01 ENCOUNTER — Inpatient Hospital Stay: Payer: Medicare Other | Attending: Oncology

## 2020-10-08 ENCOUNTER — Other Ambulatory Visit: Payer: Self-pay

## 2020-10-08 ENCOUNTER — Inpatient Hospital Stay: Payer: Medicare Other

## 2020-10-08 VITALS — BP 134/79 | HR 60 | Temp 97.8°F

## 2020-10-08 DIAGNOSIS — R7989 Other specified abnormal findings of blood chemistry: Secondary | ICD-10-CM

## 2020-10-08 NOTE — Progress Notes (Signed)
Pt received 250 ml phlebotomy today per MD orders. Tolerated procedure well. Accepted a drink. Discharged from clinic feeling at his baseline.

## 2020-10-15 ENCOUNTER — Inpatient Hospital Stay: Payer: Medicare Other

## 2020-10-15 VITALS — BP 150/80 | HR 60 | Temp 98.1°F | Resp 16

## 2020-10-15 DIAGNOSIS — R7989 Other specified abnormal findings of blood chemistry: Secondary | ICD-10-CM

## 2020-10-15 LAB — CBC
HCT: 36.7 % — ABNORMAL LOW (ref 39.0–52.0)
Hemoglobin: 13.2 g/dL (ref 13.0–17.0)
MCH: 36.4 pg — ABNORMAL HIGH (ref 26.0–34.0)
MCHC: 36 g/dL (ref 30.0–36.0)
MCV: 101.1 fL — ABNORMAL HIGH (ref 80.0–100.0)
Platelets: 179 10*3/uL (ref 150–400)
RBC: 3.63 MIL/uL — ABNORMAL LOW (ref 4.22–5.81)
RDW: 12.4 % (ref 11.5–15.5)
WBC: 4.5 10*3/uL (ref 4.0–10.5)
nRBC: 0 % (ref 0.0–0.2)

## 2020-10-15 LAB — FERRITIN: Ferritin: 179 ng/mL (ref 24–336)

## 2020-10-15 NOTE — Progress Notes (Signed)
250 ml phlebotomy performed as ordered. Pt tolerated procedure well.VSS at time of discharge.

## 2020-12-11 ENCOUNTER — Telehealth: Payer: Self-pay | Admitting: Oncology

## 2020-12-11 NOTE — Telephone Encounter (Signed)
12/11/2020 Pt left VM asking about appts. Attempted to contact pt to assist w/ scheduling but could not reach him or leave VM [facility]

## 2020-12-13 ENCOUNTER — Telehealth: Payer: Self-pay | Admitting: Oncology

## 2020-12-13 NOTE — Telephone Encounter (Signed)
Call returned to pt from VM left on 7/21 @ 4:43 that he needed to reschedule his lab appt on 7/22. No answer and unable to leave a message.

## 2020-12-14 ENCOUNTER — Inpatient Hospital Stay: Payer: Medicare Other

## 2020-12-23 IMAGING — US US ABDOMEN LIMITED
1 series · 14 of 25 positions shown · non-contrast
Comparison: By report from 05/04/2004

CLINICAL DATA: Elevated LFTs, history of porphyria

EXAM:
ULTRASOUND ABDOMEN LIMITED RIGHT UPPER QUADRANT

[Series 1: us abdomen limited · 14 of 66 slices shown]
[im 1/66]
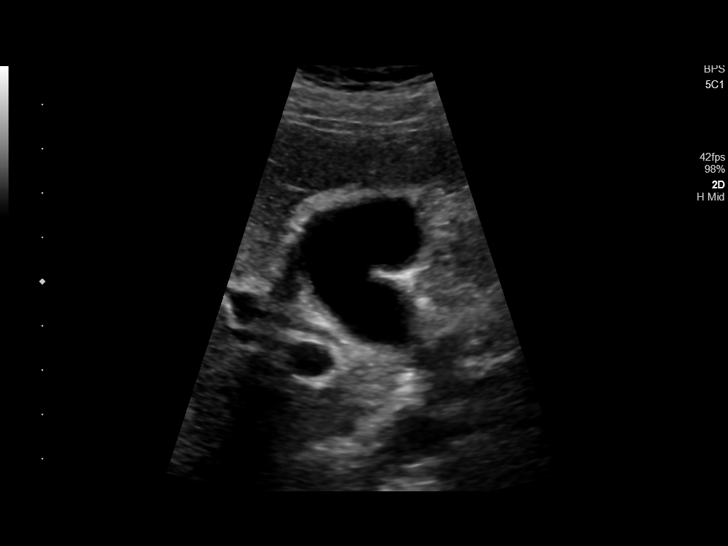
[im 6/66]
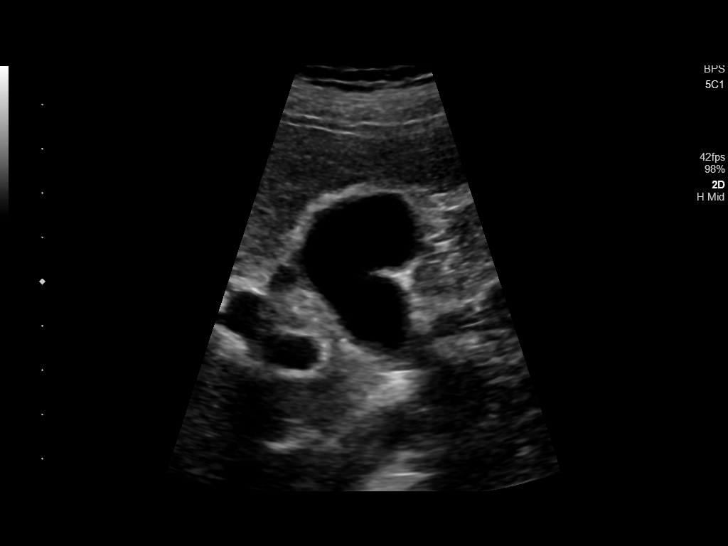
[im 11/66]
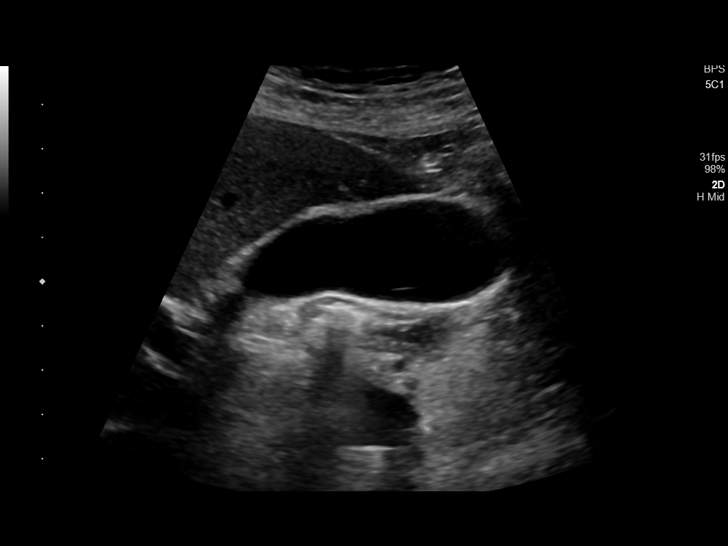
[im 17/66]
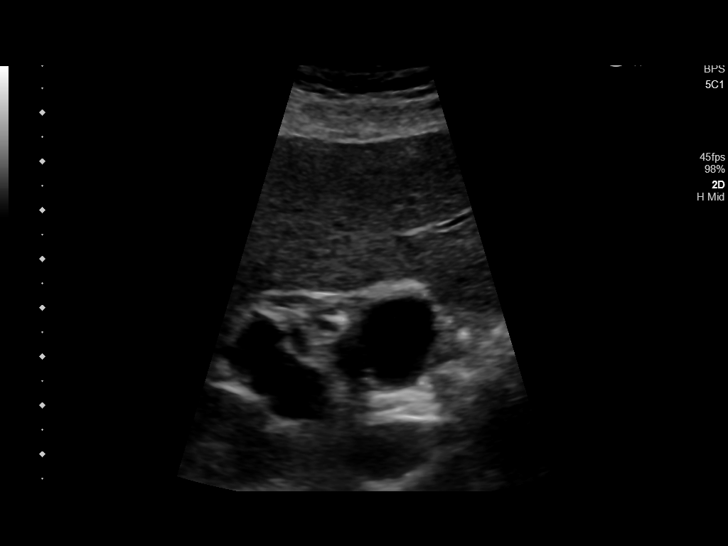
[im 22/66]
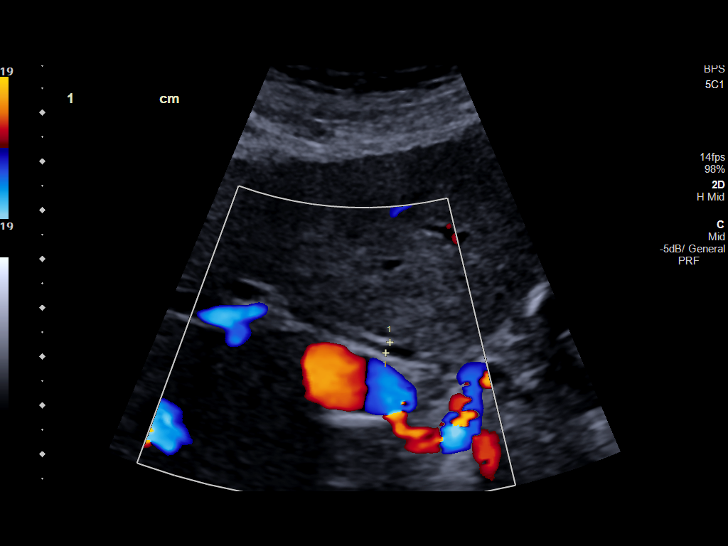
[im 25/66]
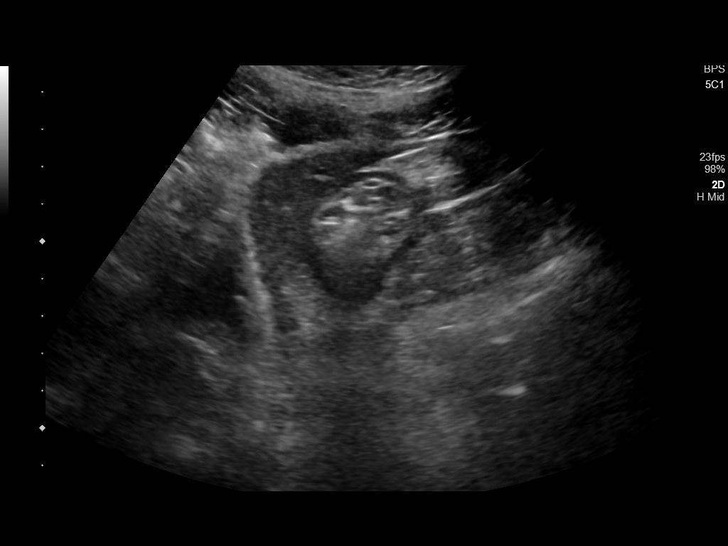
[im 30/66]
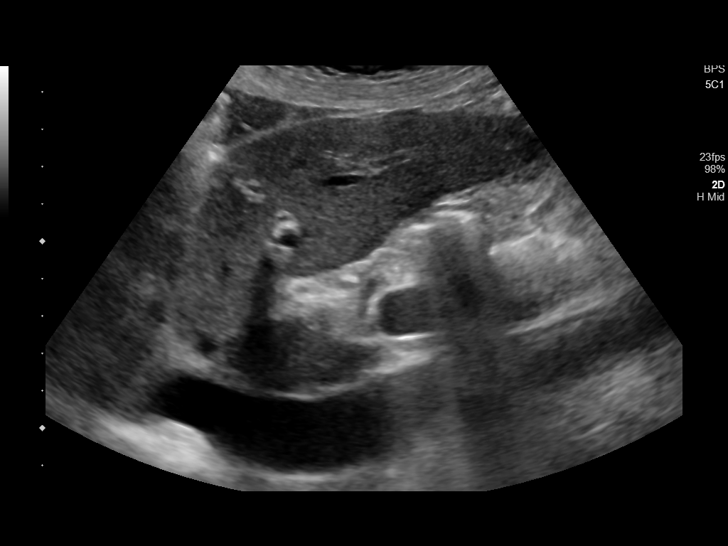
[im 36/66]
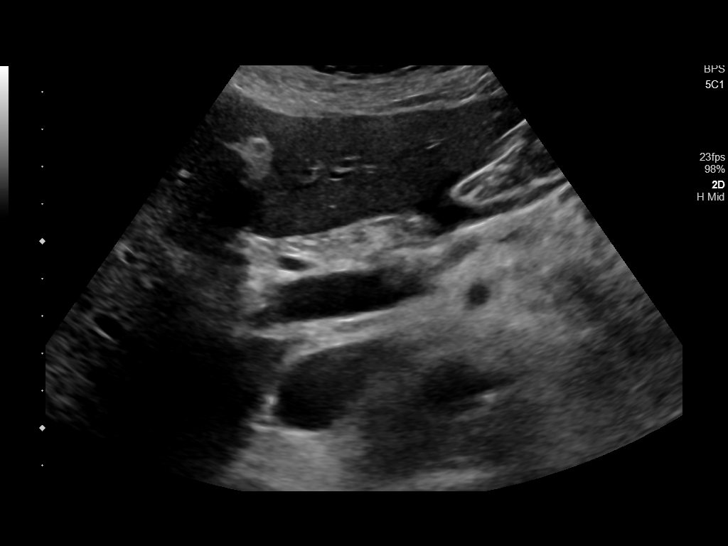
[im 41/66]
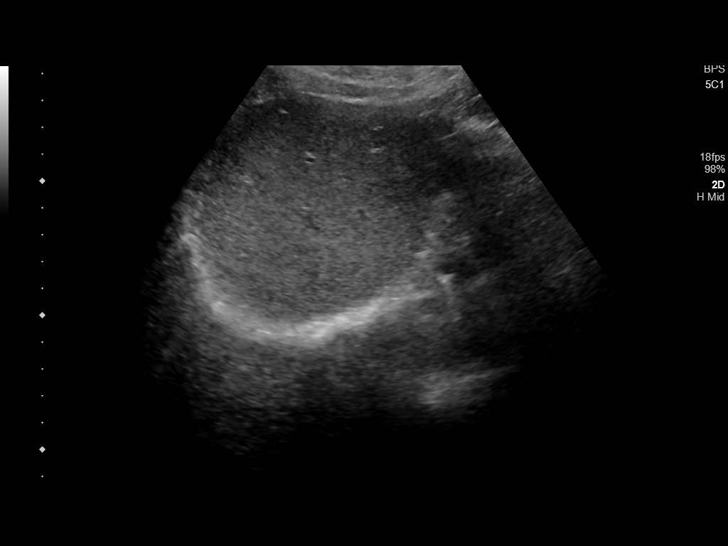
[im 44/66]
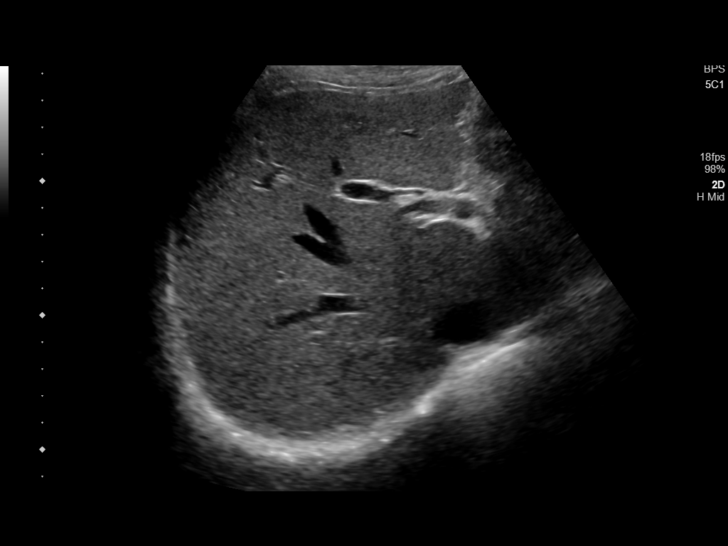
[im 49/66]
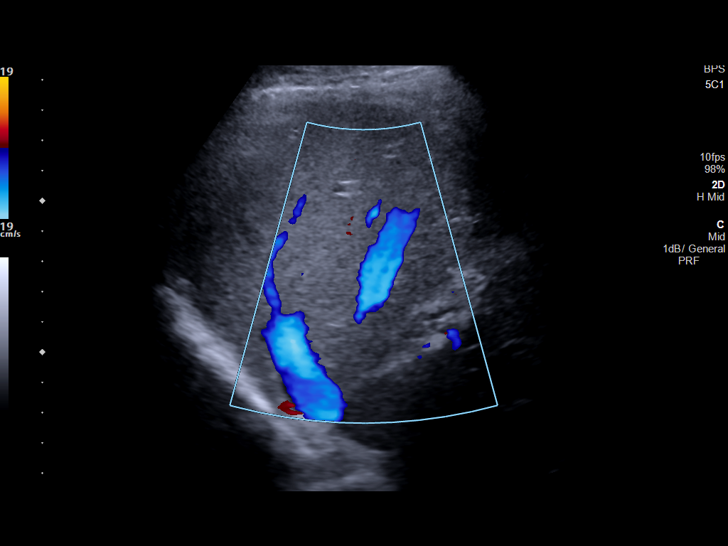
[im 55/66]
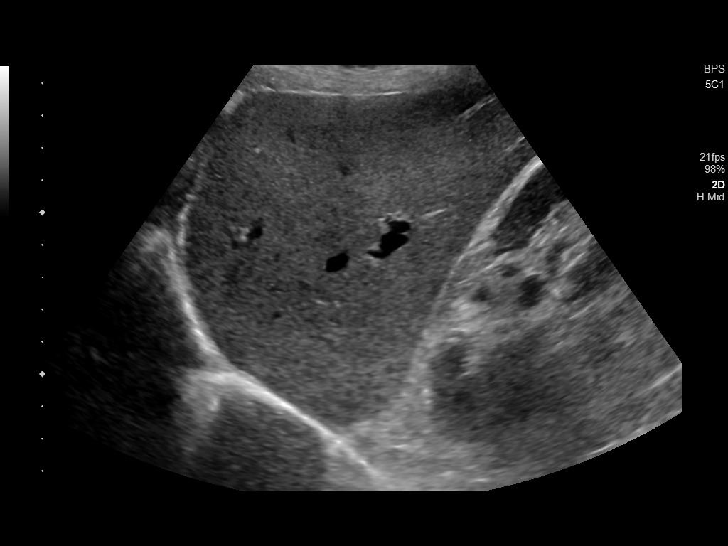
[im 60/66]
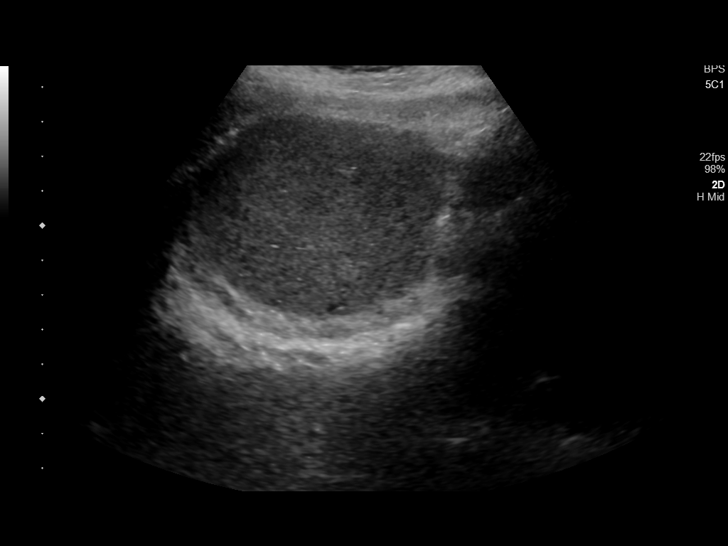
[im 66/66]
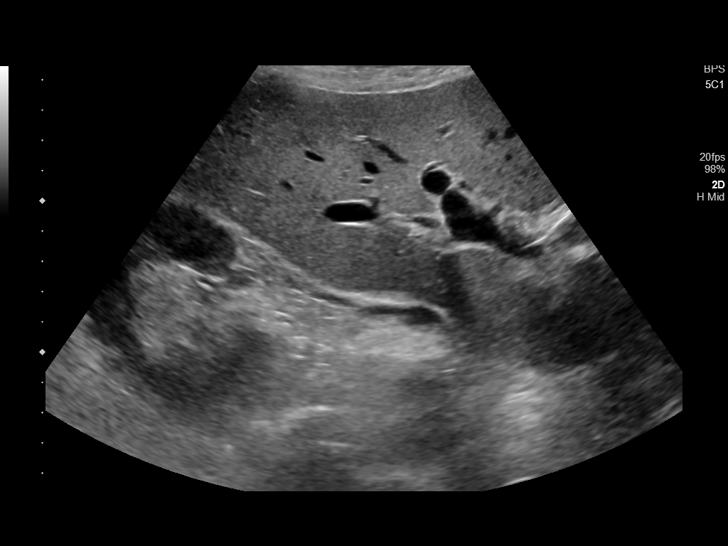

[14 of 25 positions shown; findings below may reference images not displayed]

FINDINGS: Gallbladder:

No gallstones or wall thickening visualized. No sonographic Murphy
sign noted by sonographer.

Common bile duct:

Diameter: 2.4 mm.

Liver:

No focal lesion identified. Within normal limits in parenchymal
echogenicity. Portal vein is patent on color Doppler imaging with
normal direction of blood flow towards the liver.

Other: None.
IMPRESSION: No acute abnormality noted.

## 2021-01-04 ENCOUNTER — Other Ambulatory Visit: Payer: Self-pay

## 2021-01-04 DIAGNOSIS — D539 Nutritional anemia, unspecified: Secondary | ICD-10-CM

## 2021-01-05 ENCOUNTER — Other Ambulatory Visit: Payer: Self-pay | Admitting: *Deleted

## 2021-01-05 DIAGNOSIS — R7989 Other specified abnormal findings of blood chemistry: Secondary | ICD-10-CM

## 2021-01-07 ENCOUNTER — Inpatient Hospital Stay: Payer: Medicare Other | Attending: Oncology

## 2021-01-07 DIAGNOSIS — Z79899 Other long term (current) drug therapy: Secondary | ICD-10-CM | POA: Diagnosis not present

## 2021-01-07 DIAGNOSIS — R7989 Other specified abnormal findings of blood chemistry: Secondary | ICD-10-CM

## 2021-01-07 DIAGNOSIS — D539 Nutritional anemia, unspecified: Secondary | ICD-10-CM

## 2021-01-07 LAB — CBC WITH DIFFERENTIAL/PLATELET
Abs Immature Granulocytes: 0 10*3/uL (ref 0.00–0.07)
Basophils Absolute: 0 10*3/uL (ref 0.0–0.1)
Basophils Relative: 0 %
Eosinophils Absolute: 0.1 10*3/uL (ref 0.0–0.5)
Eosinophils Relative: 2 %
HCT: 41.7 % (ref 39.0–52.0)
Hemoglobin: 14.9 g/dL (ref 13.0–17.0)
Immature Granulocytes: 0 %
Lymphocytes Relative: 21 %
Lymphs Abs: 1 10*3/uL (ref 0.7–4.0)
MCH: 37.2 pg — ABNORMAL HIGH (ref 26.0–34.0)
MCHC: 35.7 g/dL (ref 30.0–36.0)
MCV: 104 fL — ABNORMAL HIGH (ref 80.0–100.0)
Monocytes Absolute: 0.4 10*3/uL (ref 0.1–1.0)
Monocytes Relative: 9 %
Neutro Abs: 3.2 10*3/uL (ref 1.7–7.7)
Neutrophils Relative %: 68 %
Platelets: 169 10*3/uL (ref 150–400)
RBC: 4.01 MIL/uL — ABNORMAL LOW (ref 4.22–5.81)
RDW: 12.2 % (ref 11.5–15.5)
WBC: 4.7 10*3/uL (ref 4.0–10.5)
nRBC: 0 % (ref 0.0–0.2)

## 2021-01-07 LAB — COMPREHENSIVE METABOLIC PANEL
ALT: 15 U/L (ref 0–44)
AST: 19 U/L (ref 15–41)
Albumin: 4.5 g/dL (ref 3.5–5.0)
Alkaline Phosphatase: 58 U/L (ref 38–126)
Anion gap: 8 (ref 5–15)
BUN: 8 mg/dL (ref 8–23)
CO2: 25 mmol/L (ref 22–32)
Calcium: 9.6 mg/dL (ref 8.9–10.3)
Chloride: 98 mmol/L (ref 98–111)
Creatinine, Ser: 0.79 mg/dL (ref 0.61–1.24)
GFR, Estimated: >60 mL/min (ref >60)
Glucose, Bld: 107 mg/dL — ABNORMAL HIGH (ref 70–99)
Potassium: 4.2 mmol/L (ref 3.5–5.1)
Sodium: 131 mmol/L — ABNORMAL LOW (ref 135–145)
Total Bilirubin: 0.8 mg/dL (ref 0.3–1.2)
Total Protein: 7.8 g/dL (ref 6.5–8.1)

## 2021-01-07 LAB — IRON AND TIBC
Iron: 73 ug/dL (ref 45–182)
Saturation Ratios: 19 % (ref 17.9–39.5)
TIBC: 379 ug/dL (ref 250–450)
UIBC: 306 ug/dL

## 2021-01-07 LAB — FERRITIN: Ferritin: 100 ng/mL (ref 24–336)

## 2021-01-16 DIAGNOSIS — D696 Thrombocytopenia, unspecified: Secondary | ICD-10-CM | POA: Insufficient documentation

## 2021-03-15 ENCOUNTER — Other Ambulatory Visit: Payer: Self-pay

## 2021-03-15 ENCOUNTER — Inpatient Hospital Stay: Payer: Medicare Other | Admitting: Oncology

## 2021-03-15 ENCOUNTER — Inpatient Hospital Stay: Payer: Medicare Other | Attending: Oncology

## 2021-03-15 DIAGNOSIS — R7989 Other specified abnormal findings of blood chemistry: Secondary | ICD-10-CM

## 2021-03-15 LAB — CBC WITH DIFFERENTIAL/PLATELET
Abs Immature Granulocytes: 0.01 10*3/uL (ref 0.00–0.07)
Basophils Absolute: 0 10*3/uL (ref 0.0–0.1)
Basophils Relative: 0 %
Eosinophils Absolute: 0.2 10*3/uL (ref 0.0–0.5)
Eosinophils Relative: 3 %
HCT: 45 % (ref 39.0–52.0)
Hemoglobin: 15.6 g/dL (ref 13.0–17.0)
Immature Granulocytes: 0 %
Lymphocytes Relative: 23 %
Lymphs Abs: 1.2 10*3/uL (ref 0.7–4.0)
MCH: 36.1 pg — ABNORMAL HIGH (ref 26.0–34.0)
MCHC: 34.7 g/dL (ref 30.0–36.0)
MCV: 104.2 fL — ABNORMAL HIGH (ref 80.0–100.0)
Monocytes Absolute: 0.6 10*3/uL (ref 0.1–1.0)
Monocytes Relative: 10 %
Neutro Abs: 3.4 10*3/uL (ref 1.7–7.7)
Neutrophils Relative %: 64 %
Platelets: 176 10*3/uL (ref 150–400)
RBC: 4.32 MIL/uL (ref 4.22–5.81)
RDW: 13 % (ref 11.5–15.5)
WBC: 5.3 10*3/uL (ref 4.0–10.5)
nRBC: 0 % (ref 0.0–0.2)

## 2021-03-15 LAB — COMPREHENSIVE METABOLIC PANEL
ALT: 14 U/L (ref 0–44)
AST: 17 U/L (ref 15–41)
Albumin: 4.7 g/dL (ref 3.5–5.0)
Alkaline Phosphatase: 60 U/L (ref 38–126)
Anion gap: 8 (ref 5–15)
BUN: 10 mg/dL (ref 8–23)
CO2: 27 mmol/L (ref 22–32)
Calcium: 9.6 mg/dL (ref 8.9–10.3)
Chloride: 95 mmol/L — ABNORMAL LOW (ref 98–111)
Creatinine, Ser: 0.81 mg/dL (ref 0.61–1.24)
GFR, Estimated: >60 mL/min (ref >60)
Glucose, Bld: 117 mg/dL — ABNORMAL HIGH (ref 70–99)
Potassium: 4.2 mmol/L (ref 3.5–5.1)
Sodium: 130 mmol/L — ABNORMAL LOW (ref 135–145)
Total Bilirubin: 1.3 mg/dL — ABNORMAL HIGH (ref 0.3–1.2)
Total Protein: 8.2 g/dL — ABNORMAL HIGH (ref 6.5–8.1)

## 2021-03-15 LAB — FERRITIN: Ferritin: 151 ng/mL (ref 24–336)

## 2021-03-15 LAB — IRON AND TIBC
Iron: 96 ug/dL (ref 45–182)
Saturation Ratios: 25 % (ref 17.9–39.5)
TIBC: 389 ug/dL (ref 250–450)
UIBC: 293 ug/dL

## 2021-03-20 ENCOUNTER — Other Ambulatory Visit: Payer: Self-pay

## 2021-03-20 ENCOUNTER — Inpatient Hospital Stay: Payer: Medicare Other

## 2021-03-20 VITALS — BP 123/78 | HR 62 | Resp 17

## 2021-03-20 DIAGNOSIS — R7989 Other specified abnormal findings of blood chemistry: Secondary | ICD-10-CM

## 2021-03-20 NOTE — Progress Notes (Signed)
Therapeutic phlebotomy performed in [facility] using 20g angiocath. 538mL removed. Pt tolerated procedure well. Oral hydration provided. BP/pulse stable at discharge.

## 2021-03-27 ENCOUNTER — Inpatient Hospital Stay: Payer: Medicare Other | Attending: Oncology

## 2021-03-27 ENCOUNTER — Other Ambulatory Visit: Payer: Self-pay

## 2021-03-27 VITALS — BP 134/81 | HR 62 | Temp 97.9°F | Resp 16

## 2021-03-27 DIAGNOSIS — R7989 Other specified abnormal findings of blood chemistry: Secondary | ICD-10-CM

## 2021-03-27 NOTE — Patient Instructions (Signed)

## 2021-03-27 NOTE — Progress Notes (Signed)
Removed 250 ml of blood per phlebotomy orders. Patient tolerated procedure well. VSS. Discharge to home.

## 2021-04-17 ENCOUNTER — Inpatient Hospital Stay (HOSPITAL_BASED_OUTPATIENT_CLINIC_OR_DEPARTMENT_OTHER): Payer: Medicare Other | Admitting: Oncology

## 2021-04-17 ENCOUNTER — Other Ambulatory Visit: Payer: Self-pay | Admitting: *Deleted

## 2021-04-17 ENCOUNTER — Encounter: Payer: Self-pay | Admitting: Oncology

## 2021-04-17 ENCOUNTER — Other Ambulatory Visit: Payer: Self-pay

## 2021-04-17 VITALS — BP 130/80 | HR 65 | Temp 99.3°F | Resp 16 | Wt 155.8 lb

## 2021-04-17 DIAGNOSIS — D539 Nutritional anemia, unspecified: Secondary | ICD-10-CM | POA: Diagnosis not present

## 2021-04-17 DIAGNOSIS — R7989 Other specified abnormal findings of blood chemistry: Secondary | ICD-10-CM

## 2021-04-17 NOTE — Progress Notes (Signed)
Pt states lately he has been feeling very tired. Has a mutual friend who deals with the same dx and he is currently getting testortone inj, and will like to discuss the same inj to help with the fatigue. 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

## 2021-04-17 NOTE — Progress Notes (Signed)
Hematology/Oncology Consult note [facility]  Telephone:([phone] [phone]  Patient Care Team: Idelle Crouch, MD as PCP - General (Internal Medicine) Sindy Guadeloupe, MD as Consulting Physician (Hematology and Oncology)   Name of the patient: Shawn Gonzalez  [id number]  07-Jan-1956   Date of visit: 04/17/21  Diagnosis-porphyria cutanea tarda  Chief complaint/ Reason for visit-routine follow-up of porphyria cutanea tarda for possible phlebotomy  Heme/Onc history: Patient is a 65 year old male with a past medical history significant for hypertension, anxiety, chronic back pain and tobacco dependence.  He has had on and off blistering of his bilateral hands and has seen dermatology in the past and was attributed to hematological disorder.  Patient states that he has came to the cancer center many years ago and underwent phlebotomy at that time.  He is unsure if he has hemochromatosis.  Family history of any liver disease.  He does report drinking alcohol sometimes he may have a few beers and 1 go sometimes he may go for days without drink.  He also smokes 1 pack of cigarettes per day for the last 42 years.   Results of blood work from 12/10/2018 showed elevated ferritin of 772.  HIV and hepatitis B and C testing was negative.  HFE gene testing was negative.     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  He does feel fatigued  when he gets a phlebotomy.  He drinks about 2 beers a day.  Denies any significant skin rash.  ECOG PS- 1 Pain scale- 0   Review of systems- Review of Systems  Constitutional:  Positive for malaise/fatigue. Negative for chills, fever and weight loss.  HENT:  Negative for congestion, ear discharge and nosebleeds.   Eyes:  Negative for blurred vision.  Respiratory:  Negative for cough, hemoptysis, sputum production, shortness of breath and wheezing.   Cardiovascular:  Negative for chest pain, palpitations, orthopnea and claudication.  Gastrointestinal:  Negative for abdominal pain, blood in stool, constipation, diarrhea, heartburn, melena, nausea and vomiting.  Genitourinary:  Negative for dysuria, flank pain, frequency, hematuria and urgency.  Musculoskeletal:  Negative for back pain, joint pain and myalgias.  Skin:  Negative for rash.  Neurological:  Negative for dizziness, tingling, focal weakness, seizures, weakness and headaches.  Endo/Heme/Allergies:  Does not bruise/bleed easily.  Psychiatric/Behavioral:  Negative for depression and suicidal ideas. The patient does not have insomnia.      Allergies  Allergen Reactions   Celebrex [Celecoxib] Nausea Only     Past Medical History:  Diagnosis Date   Anxiety    Chronic low back pain    Depression    Elevated ferritin    Erectile dysfunction    History of chicken pox    Hypertension    Insomnia    Porphyria cutanea tarda (  [facility])      Past Surgical History:  Procedure Laterality Date   COLONOSCOPY WITH PROPOFOL N/A 10/31/2016   Procedure: COLONOSCOPY WITH PROPOFOL;  Surgeon: Manya Silvas, MD;  Location: [facility] ENDOSCOPY;  Service: Endoscopy;  Laterality: N/A;   FINGER SURGERY Right    RT.INDEX   HERNIA REPAIR     MULTIPLE TOOTH EXTRACTIONS     XI ROBOTIC ASSISTED INGUINAL HERNIA REPAIR WITH MESH Left 09/27/2020   Procedure: XI ROBOTIC ASSISTED INGUINAL HERNIA REPAIR WITH MESH;  Surgeon: Benjamine Sprague, DO;  Location: [facility]  ORS;  Service: General;  Laterality: Left;    Social History   Socioeconomic History   Marital status: Married    Spouse name: Not on file   Number of children: Not on file   Years of education: Not on file   Highest education level: Not on file  Occupational History   Not on file  Tobacco Use   Smoking status: Every Day    Packs/day: 1.00    Years: 42.00    Pack years: 42.00    Types: Cigarettes   Smokeless tobacco: Never  Vaping Use   Vaping Use: Never used  Substance and Sexual Activity   Alcohol use: Yes    Alcohol/week: 18.0 standard drinks    Types: 18 Cans of beer per week   Drug use: No   Sexual activity: Not on file  Other Topics Concern   Not on file  Social History Narrative   Not on file   Social Determinants of Health   Financial Resource Strain: Not on file  Food Insecurity: Not on file  Transportation Needs: Not on file  Physical Activity: Not on file  Stress: Not on file  Social Connections: Not on file  Intimate Partner Violence: Not on file    History reviewed. No pertinent family history.   Current Outpatient Medications:    amLODipine (NORVASC) 10 MG tablet, Take 1 tablet by mouth daily., Disp: , Rfl:    bisoprolol-hydrochlorothiazide (ZIAC) 5-6.25 MG tablet, Take 1 tablet by mouth daily., Disp: , Rfl:    cyclobenzaprine (FLEXERIL) 10 MG tablet, Take 1 tablet by mouth 3 (three) times daily as needed., Disp: , Rfl:    HYDROcodone-acetaminophen (NORCO) 5-325 MG tablet, Take 1 tablet by mouth every 6 (six) hours as needed for up to 6 doses for moderate pain., Disp: 6 tablet, Rfl: 0   lisinopril (PRINIVIL,ZESTRIL) 40 MG tablet, Take 40 mg by mouth daily., Disp: , Rfl:   Physical exam:  Vitals:   04/17/21 1038  BP: 130/80  Pulse: 65  Resp: 16  Temp: 99.3 F (37.4 C)  SpO2: 100%  Weight: 155 lb 12.8 oz (70.7 kg)   Physical Exam Constitutional:      General: He is not in acute distress.    Comments: Face appears plethoric   Cardiovascular:     Rate and Rhythm: Normal rate and regular rhythm.     Heart sounds: Normal heart sounds.  Pulmonary:     Effort: Pulmonary effort is normal.     Breath sounds: Normal breath sounds.  Abdominal:     General: Bowel sounds are normal.     Palpations: Abdomen is soft.  Skin:    General: Skin is warm and dry.  Neurological:     Mental Status: He is alert and oriented to person, place, and time.     CMP Latest Ref Rng & Units 03/15/2021  Glucose 70 - 99 mg/dL 117(H)  BUN 8 -  23 mg/dL 10  Creatinine 0.61 - 1.24 mg/dL 0.81  Sodium 135 - 145 mmol/L 130(L)  Potassium 3.5 - 5.1 mmol/L 4.2  Chloride 98 - 111 mmol/L 95(L)  CO2 22 - 32 mmol/L 27  Calcium 8.9 - 10.3 mg/dL 9.6  Total Protein 6.5 - 8.1 g/dL 8.2(H)  Total Bilirubin 0.3 - 1.2 mg/dL 1.3(H)  Alkaline Phos 38 - 126 U/L 60  AST 15 - 41 U/L 17  ALT 0 - 44 U/L 14   CBC Latest Ref Rng & Units 03/15/2021  WBC 4.0 - 10.5 K/uL 5.3  Hemoglobin 13.0 - 17.0 g/dL 15.6  Hematocrit 39.0 - 52.0 % 45.0  Platelets 150 - 400 K/uL 176      Assessment and plan- Patient is a 65 y.o. male with history of porphyria cutanea tarda here for possible phlebotomy  Last ferritin was checked a month ago and it was more than 100.  He has not had blood work recently.  Plan to check CBC ferritin and B12 levels next week.  If ferritin levels are more than 100 he will proceed with weekly phlebotomy.  His B12 levels have been chronically low and he does not appear to be consistent in taking B12 supplements.  I will start him on B12 monthly injections given his fatigue if his levels come back less than 300.  CBC ferritin iron studies CMP B12 in 3 in 6 months and I will see him back in 6 months Visit Diagnosis 1. Elevated ferritin level   2. Macrocytic anemia      Dr. Randa Evens, MD, MPH [facility] at [facility] [phone] 04/17/2021 3:26 PM

## 2021-04-23 ENCOUNTER — Other Ambulatory Visit: Payer: Self-pay

## 2021-04-23 ENCOUNTER — Inpatient Hospital Stay: Payer: Medicare Other

## 2021-04-23 DIAGNOSIS — R7989 Other specified abnormal findings of blood chemistry: Secondary | ICD-10-CM

## 2021-04-23 DIAGNOSIS — D539 Nutritional anemia, unspecified: Secondary | ICD-10-CM

## 2021-04-23 LAB — IRON AND TIBC
Iron: 64 ug/dL (ref 45–182)
Saturation Ratios: 16 % — ABNORMAL LOW (ref 17.9–39.5)
TIBC: 391 ug/dL (ref 250–450)
UIBC: 327 ug/dL

## 2021-04-23 LAB — COMPREHENSIVE METABOLIC PANEL
ALT: 14 U/L (ref 0–44)
AST: 17 U/L (ref 15–41)
Albumin: 4.7 g/dL (ref 3.5–5.0)
Alkaline Phosphatase: 64 U/L (ref 38–126)
Anion gap: 11 (ref 5–15)
BUN: 8 mg/dL (ref 8–23)
CO2: 24 mmol/L (ref 22–32)
Calcium: 9.5 mg/dL (ref 8.9–10.3)
Chloride: 99 mmol/L (ref 98–111)
Creatinine, Ser: 0.79 mg/dL (ref 0.61–1.24)
GFR, Estimated: >60 mL/min (ref >60)
Glucose, Bld: 113 mg/dL — ABNORMAL HIGH (ref 70–99)
Potassium: 4.3 mmol/L (ref 3.5–5.1)
Sodium: 134 mmol/L — ABNORMAL LOW (ref 135–145)
Total Bilirubin: 0.5 mg/dL (ref 0.3–1.2)
Total Protein: 7.7 g/dL (ref 6.5–8.1)

## 2021-04-23 LAB — CBC WITH DIFFERENTIAL/PLATELET
Abs Immature Granulocytes: 0 10*3/uL (ref 0.00–0.07)
Basophils Absolute: 0 10*3/uL (ref 0.0–0.1)
Basophils Relative: 1 %
Eosinophils Absolute: 0.1 10*3/uL (ref 0.0–0.5)
Eosinophils Relative: 3 %
HCT: 43.5 % (ref 39.0–52.0)
Hemoglobin: 14.8 g/dL (ref 13.0–17.0)
Immature Granulocytes: 0 %
Lymphocytes Relative: 21 %
Lymphs Abs: 0.9 10*3/uL (ref 0.7–4.0)
MCH: 36.6 pg — ABNORMAL HIGH (ref 26.0–34.0)
MCHC: 34 g/dL (ref 30.0–36.0)
MCV: 107.7 fL — ABNORMAL HIGH (ref 80.0–100.0)
Monocytes Absolute: 0.4 10*3/uL (ref 0.1–1.0)
Monocytes Relative: 9 %
Neutro Abs: 3 10*3/uL (ref 1.7–7.7)
Neutrophils Relative %: 66 %
Platelets: 180 10*3/uL (ref 150–400)
RBC: 4.04 MIL/uL — ABNORMAL LOW (ref 4.22–5.81)
RDW: 12.5 % (ref 11.5–15.5)
WBC: 4.5 10*3/uL (ref 4.0–10.5)
nRBC: 0 % (ref 0.0–0.2)

## 2021-04-23 LAB — FERRITIN: Ferritin: 59 ng/mL (ref 24–336)

## 2021-04-23 LAB — VITAMIN B12: Vitamin B-12: 149 pg/mL — ABNORMAL LOW (ref 180–914)

## 2021-04-24 ENCOUNTER — Telehealth: Payer: Self-pay | Admitting: *Deleted

## 2021-04-24 NOTE — Telephone Encounter (Signed)
Have been trying to call patient so that we can let him know that the fax number that was given is not going through to the [place] court house.  He had wanted a letter for jury duty.  I was finally able to get in touch with the patient because before now when we would call no one would answer and voicemail full   The other phone would ring and says there is no voicemail set up.  The patient went and gave me the telephone number to the [phone].  Philis Nettle gave me the fax number of [phone].  Because of [place] not being able to get it in on Wednesday due to the fax number was not correct.  I asked Philis Nettle if there is any way that we could still get it to her and he would still be okay with not being at jury duty on December 1.  She gave me her email of [email or web address] and says she would take care of it so I did send it that way.  Patient is aware that I sent it in by email and not sure if it is going to be approved or not for the jury duty and he understands.

## 2021-04-24 NOTE — Progress Notes (Signed)
I spoke to pt today about the jury  letter and I told him on the phone that b12 levels were low and wanted him to take b12 inj. Marland Kitchen He could get them monthly at cancer center or we can send rx into pharmacy and his wife could give them. He will speak with wife and let us know.

## 2021-04-25 NOTE — Progress Notes (Signed)
I spoke to him yesterday about another matter and told him that b12 level was low and dr Janese Banks rec: b12 inj. Monthly. He can get the shots here at cancer center or pt could have his wife give it. He is going to talk to wife and let us know. He has my direct number to call me with response.

## 2021-04-29 ENCOUNTER — Other Ambulatory Visit: Payer: Self-pay

## 2021-04-29 MED ORDER — CYANOCOBALAMIN 1000 MCG/ML IJ SOLN: 1000.0000 ug | INTRAMUSCULAR | 0 refills | Status: DC

## 2021-04-29 MED ORDER — "SYRINGE 25G X 1"" 3 ML MISC": 3.0000 mL | 0 refills | Status: DC

## 2021-04-29 NOTE — Progress Notes (Signed)
Reached out to pt in regards to B12 injections and his preference whether to have them done here at the facility or in his home. Per pt, he spoke to his wife and she is okay with administering injections monthly. Sent rx to pharmacy for pick up. Pt understands and agrees.

## 2021-07-10 ENCOUNTER — Other Ambulatory Visit: Payer: Self-pay | Admitting: *Deleted

## 2021-07-10 DIAGNOSIS — R7989 Other specified abnormal findings of blood chemistry: Secondary | ICD-10-CM

## 2021-07-19 ENCOUNTER — Inpatient Hospital Stay: Payer: Medicare Other | Attending: Oncology

## 2021-07-19 ENCOUNTER — Other Ambulatory Visit: Payer: Self-pay

## 2021-07-19 DIAGNOSIS — R7989 Other specified abnormal findings of blood chemistry: Secondary | ICD-10-CM | POA: Diagnosis not present

## 2021-07-19 DIAGNOSIS — D539 Nutritional anemia, unspecified: Secondary | ICD-10-CM | POA: Diagnosis not present

## 2021-07-19 LAB — CBC WITH DIFFERENTIAL/PLATELET
Abs Immature Granulocytes: 0.01 10*3/uL (ref 0.00–0.07)
Basophils Absolute: 0 10*3/uL (ref 0.0–0.1)
Basophils Relative: 0 %
Eosinophils Absolute: 0 10*3/uL (ref 0.0–0.5)
Eosinophils Relative: 1 %
HCT: 41.1 % (ref 39.0–52.0)
Hemoglobin: 14.6 g/dL (ref 13.0–17.0)
Immature Granulocytes: 0 %
Lymphocytes Relative: 14 %
Lymphs Abs: 0.7 10*3/uL (ref 0.7–4.0)
MCH: 36.4 pg — ABNORMAL HIGH (ref 26.0–34.0)
MCHC: 35.5 g/dL (ref 30.0–36.0)
MCV: 102.5 fL — ABNORMAL HIGH (ref 80.0–100.0)
Monocytes Absolute: 0.4 10*3/uL (ref 0.1–1.0)
Monocytes Relative: 9 %
Neutro Abs: 3.8 10*3/uL (ref 1.7–7.7)
Neutrophils Relative %: 76 %
Platelets: 174 10*3/uL (ref 150–400)
RBC: 4.01 MIL/uL — ABNORMAL LOW (ref 4.22–5.81)
RDW: 13.2 % (ref 11.5–15.5)
WBC: 5 10*3/uL (ref 4.0–10.5)
nRBC: 0 % (ref 0.0–0.2)

## 2021-07-19 LAB — COMPREHENSIVE METABOLIC PANEL
ALT: 12 U/L (ref 0–44)
AST: 18 U/L (ref 15–41)
Albumin: 4.2 g/dL (ref 3.5–5.0)
Alkaline Phosphatase: 52 U/L (ref 38–126)
Anion gap: 8 (ref 5–15)
BUN: 9 mg/dL (ref 8–23)
CO2: 23 mmol/L (ref 22–32)
Calcium: 9.2 mg/dL (ref 8.9–10.3)
Chloride: 98 mmol/L (ref 98–111)
Creatinine, Ser: 0.72 mg/dL (ref 0.61–1.24)
GFR, Estimated: >60 mL/min (ref >60)
Glucose, Bld: 139 mg/dL — ABNORMAL HIGH (ref 70–99)
Potassium: 4 mmol/L (ref 3.5–5.1)
Sodium: 129 mmol/L — ABNORMAL LOW (ref 135–145)
Total Bilirubin: 0.5 mg/dL (ref 0.3–1.2)
Total Protein: 7.8 g/dL (ref 6.5–8.1)

## 2021-07-19 LAB — IRON AND TIBC
Iron: 93 ug/dL (ref 45–182)
Saturation Ratios: 25 % (ref 17.9–39.5)
TIBC: 367 ug/dL (ref 250–450)
UIBC: 274 ug/dL

## 2021-07-19 LAB — FERRITIN: Ferritin: 104 ng/mL (ref 24–336)

## 2021-07-19 LAB — VITAMIN B12: Vitamin B-12: 327 pg/mL (ref 180–914)

## 2021-10-16 ENCOUNTER — Inpatient Hospital Stay: Payer: Medicare Other

## 2021-10-16 ENCOUNTER — Inpatient Hospital Stay: Payer: Medicare Other | Admitting: Oncology

## 2021-11-06 ENCOUNTER — Inpatient Hospital Stay: Payer: Medicare Other

## 2021-11-06 ENCOUNTER — Inpatient Hospital Stay: Payer: Medicare Other | Admitting: Medical Oncology

## 2021-11-22 ENCOUNTER — Inpatient Hospital Stay (HOSPITAL_BASED_OUTPATIENT_CLINIC_OR_DEPARTMENT_OTHER): Payer: Medicare Other | Admitting: Medical Oncology

## 2021-11-22 ENCOUNTER — Inpatient Hospital Stay: Payer: Medicare Other | Attending: Oncology

## 2021-11-22 DIAGNOSIS — F17210 Nicotine dependence, cigarettes, uncomplicated: Secondary | ICD-10-CM | POA: Insufficient documentation

## 2021-11-22 DIAGNOSIS — R7989 Other specified abnormal findings of blood chemistry: Secondary | ICD-10-CM

## 2021-11-22 DIAGNOSIS — D539 Nutritional anemia, unspecified: Secondary | ICD-10-CM

## 2021-11-22 DIAGNOSIS — Z79899 Other long term (current) drug therapy: Secondary | ICD-10-CM | POA: Insufficient documentation

## 2021-11-22 LAB — IRON AND TIBC
Iron: 121 ug/dL (ref 45–182)
Saturation Ratios: 36 % (ref 17.9–39.5)
TIBC: 337 ug/dL (ref 250–450)
UIBC: 216 ug/dL

## 2021-11-22 LAB — CBC WITH DIFFERENTIAL/PLATELET
Abs Immature Granulocytes: 0.02 10*3/uL (ref 0.00–0.07)
Basophils Absolute: 0 10*3/uL (ref 0.0–0.1)
Basophils Relative: 1 %
Eosinophils Absolute: 0.3 10*3/uL (ref 0.0–0.5)
Eosinophils Relative: 7 %
HCT: 41.9 % (ref 39.0–52.0)
Hemoglobin: 14.8 g/dL (ref 13.0–17.0)
Immature Granulocytes: 1 %
Lymphocytes Relative: 27 %
Lymphs Abs: 1 10*3/uL (ref 0.7–4.0)
MCH: 37.2 pg — ABNORMAL HIGH (ref 26.0–34.0)
MCHC: 35.3 g/dL (ref 30.0–36.0)
MCV: 105.3 fL — ABNORMAL HIGH (ref 80.0–100.0)
Monocytes Absolute: 0.3 10*3/uL (ref 0.1–1.0)
Monocytes Relative: 9 %
Neutro Abs: 2.2 10*3/uL (ref 1.7–7.7)
Neutrophils Relative %: 55 %
Platelets: 169 10*3/uL (ref 150–400)
RBC: 3.98 MIL/uL — ABNORMAL LOW (ref 4.22–5.81)
RDW: 12.4 % (ref 11.5–15.5)
WBC: 3.8 10*3/uL — ABNORMAL LOW (ref 4.0–10.5)
nRBC: 0 % (ref 0.0–0.2)

## 2021-11-22 LAB — VITAMIN B12: Vitamin B-12: 214 pg/mL (ref 180–914)

## 2021-11-22 LAB — COMPREHENSIVE METABOLIC PANEL
ALT: 14 U/L (ref 0–44)
AST: 16 U/L (ref 15–41)
Albumin: 4.2 g/dL (ref 3.5–5.0)
Alkaline Phosphatase: 56 U/L (ref 38–126)
Anion gap: 5 (ref 5–15)
BUN: 9 mg/dL (ref 8–23)
CO2: 27 mmol/L (ref 22–32)
Calcium: 9 mg/dL (ref 8.9–10.3)
Chloride: 98 mmol/L (ref 98–111)
Creatinine, Ser: 0.83 mg/dL (ref 0.61–1.24)
GFR, Estimated: >60 mL/min (ref >60)
Glucose, Bld: 110 mg/dL — ABNORMAL HIGH (ref 70–99)
Potassium: 4.7 mmol/L (ref 3.5–5.1)
Sodium: 130 mmol/L — ABNORMAL LOW (ref 135–145)
Total Bilirubin: 0.6 mg/dL (ref 0.3–1.2)
Total Protein: 7.2 g/dL (ref 6.5–8.1)

## 2021-11-22 LAB — FERRITIN: Ferritin: 142 ng/mL (ref 24–336)

## 2021-11-22 NOTE — Progress Notes (Signed)
Returns for follow-up. No new concerns.  

## 2021-11-22 NOTE — Progress Notes (Signed)
Hematology/Oncology Consult note [facility]  [phone] [phone]  Patient Care Team: Idelle Crouch, MD as PCP - General (Internal Medicine) Sindy Guadeloupe, MD as Consulting Physician (Hematology and Oncology)   Name of the patient: Shawn Gonzalez  [id number]  June 28, 1955   Date of visit: 11/22/21  Diagnosis-porphyria cutanea tarda  Chief complaint/ Reason for visit-routine follow-up of porphyria cutanea tarda for possible phlebotomy  Heme/Onc history: Patient is a 66 year old male with a past medical history significant for hypertension, anxiety, chronic back pain and tobacco dependence.  He has had on and off blistering of his bilateral hands and has seen dermatology in the past and was attributed to hematological disorder.  Patient states that he has came to the cancer center many years ago and underwent phlebotomy at that time.  He is unsure if he has hemochromatosis.  Family history of any liver disease.  He does report drinking alcohol sometimes he may have a few beers and 1 go sometimes he may go for days without drink.  He also smokes 1 pack of cigarettes per day for the last 42 years.   Results of blood work from 12/10/2018 showed elevated ferritin of 772.  HIV and hepatitis B and C testing was negative.  HFE gene testing was negative.    Ref Range & Units 4wk ago  Uroporphyrins (UP) 0 - 20 ug/L 380High    Heptacarboxyl (7-CP) 0 - 2 ug/L 395High    Hexacarboxyl (6-CP) 0 - 1 ug/L <1   Pentacarboxyl (5-CP) 0 - 2 ug/L 38High    Coproporphyrin (CP) I 0 - 15 ug/L 64High    Coproporphyrin (CP) III 0 - 49 ug/L 58High       Overall results consistent with porphyria cutanea tarda and patient was started on phlebotomy to keep initial ferritin less than 25 followed by maintenance phlebotomy if ferritin is greater than 100  Interval history- Patient has been doing well. Last ferritin was 104.  He does feel fatigued when he gets a  phlebotomy but denies being lightheaded.  He drinks about 2 beers a day.  Denies any significant skin rash for many years.   ECOG PS- 1 Pain scale- 0   Review of systems- Review of Systems  Constitutional:  Negative for chills, fever, malaise/fatigue and weight loss.  HENT:  Negative for congestion, ear discharge and nosebleeds.   Eyes:  Negative for blurred vision.  Respiratory:  Negative for cough, hemoptysis, sputum production, shortness of breath and wheezing.   Cardiovascular:  Negative for chest pain, palpitations, orthopnea and claudication.  Gastrointestinal:  Negative for abdominal pain, blood in stool, constipation, diarrhea, heartburn, melena, nausea and vomiting.  Genitourinary:  Negative for dysuria, flank pain, frequency, hematuria and urgency.  Musculoskeletal:  Negative for back pain, joint pain and myalgias.  Skin:  Negative for rash.  Neurological:  Negative for dizziness, tingling, focal weakness, seizures, weakness and headaches.  Endo/Heme/Allergies:  Does not bruise/bleed easily.  Psychiatric/Behavioral:  Negative for depression and suicidal ideas. The patient does not have insomnia.       Allergies  Allergen Reactions   Celebrex [Celecoxib] Nausea Only     Past Medical History:  Diagnosis Date   Anxiety    Chronic low back pain    Depression    Elevated ferritin    Erectile dysfunction    History of chicken pox    Hypertension    Insomnia    Porphyria cutanea tarda ([place])  Past Surgical History:  Procedure Laterality Date   COLONOSCOPY WITH PROPOFOL N/A 10/31/2016   Procedure: COLONOSCOPY WITH PROPOFOL;  Surgeon: Manya Silvas, MD;  Location: [facility] ENDOSCOPY;  Service: Endoscopy;  Laterality: N/A;   FINGER SURGERY Right    RT.INDEX   HERNIA REPAIR     MULTIPLE TOOTH EXTRACTIONS     XI ROBOTIC ASSISTED INGUINAL HERNIA REPAIR WITH MESH Left 09/27/2020   Procedure: XI ROBOTIC ASSISTED INGUINAL HERNIA REPAIR WITH MESH;  Surgeon: Benjamine Sprague,  DO;  Location: [facility] ORS;  Service: General;  Laterality: Left;    Social History   Socioeconomic History   Marital status: Married    Spouse name: Not on file   Number of children: Not on file   Years of education: Not on file   Highest education level: Not on file  Occupational History   Not on file  Tobacco Use   Smoking status: Every Day    Packs/day: 1.00    Years: 42.00    Total pack years: 42.00    Types: Cigarettes   Smokeless tobacco: Never  Vaping Use   Vaping Use: Never used  Substance and Sexual Activity   Alcohol use: Yes    Alcohol/week: 18.0 standard drinks of alcohol    Types: 18 Cans of beer per week   Drug use: No   Sexual activity: Not on file  Other Topics Concern   Not on file  Social History Narrative   Not on file   Social Determinants of Health   Financial Resource Strain: Not on file  Food Insecurity: Not on file  Transportation Needs: Not on file  Physical Activity: Not on file  Stress: Not on file  Social Connections: Not on file  Intimate Partner Violence: Not on file    No family history on file.   Current Outpatient Medications:    amLODipine (NORVASC) 10 MG tablet, Take 1 tablet by mouth daily., Disp: , Rfl:    bisoprolol-hydrochlorothiazide (ZIAC) 5-6.25 MG tablet, Take 1 tablet by mouth daily., Disp: , Rfl:    cyanocobalamin (,VITAMIN B-12,) 1000 MCG/ML injection, Inject 1 mL (1,000 mcg total) into the muscle every 30 (thirty) days., Disp: 12 mL, Rfl: 0   cyclobenzaprine (FLEXERIL) 10 MG tablet, Take 1 tablet by mouth 3 (three) times daily as needed., Disp: , Rfl:    HYDROcodone-acetaminophen (NORCO) 5-325 MG tablet, Take 1 tablet by mouth every 6 (six) hours as needed for up to 6 doses for moderate pain., Disp: 6 tablet, Rfl: 0   lisinopril (PRINIVIL,ZESTRIL) 40 MG tablet, Take 40 mg by mouth daily., Disp: , Rfl:    Syringe/Needle, Disp, (SYRINGE 3CC/25GX1") 25G X 1" 3 ML MISC, 3 mLs by Does not apply route every 30 (thirty)  days., Disp: 12 each, Rfl: 0  Physical exam:  Vitals:   11/22/21 0937  BP: 135/78  Pulse: 60  Resp: 16  Temp: 98.9 F (37.2 C)  TempSrc: Tympanic  SpO2: 100%   Physical Exam Constitutional:      General: He is not in acute distress.    Comments: Face appears plethoric  Cardiovascular:     Rate and Rhythm: Normal rate and regular rhythm.     Heart sounds: Normal heart sounds.  Pulmonary:     Effort: Pulmonary effort is normal.     Breath sounds: Normal breath sounds.  Abdominal:     General: Bowel sounds are normal.     Palpations: Abdomen is soft.  Skin:    General:  Skin is warm and dry.  Neurological:     Mental Status: He is alert and oriented to person, place, and time.         Latest Ref Rng & Units 11/22/2021    9:01 AM  CMP  Glucose 70 - 99 mg/dL 110   BUN 8 - 23 mg/dL 9   Creatinine 0.61 - 1.24 mg/dL 0.83   Sodium 135 - 145 mmol/L 130   Potassium 3.5 - 5.1 mmol/L 4.7   Chloride 98 - 111 mmol/L 98   CO2 22 - 32 mmol/L 27   Calcium 8.9 - 10.3 mg/dL 9.0   Total Protein 6.5 - 8.1 g/dL 7.2   Total Bilirubin 0.3 - 1.2 mg/dL 0.6   Alkaline Phos 38 - 126 U/L 56   AST 15 - 41 U/L 16   ALT 0 - 44 U/L 14       Latest Ref Rng & Units 11/22/2021    9:01 AM  CBC  WBC 4.0 - 10.5 K/uL 3.8   Hemoglobin 13.0 - 17.0 g/dL 14.8   Hematocrit 39.0 - 52.0 % 41.9   Platelets 150 - 400 K/uL 169       Assessment and plan- Patient is a 67 y.o. male with history of porphyria cutanea tarda here for possible phlebotomy  Chronic. If ferritin levels are more than 100 he will proceed with weekly phlebotomy. Ferritin pending.  Chronic. Still elevated despite B12 injections. Will monitor.    CBC ferritin iron studies CMP B12 in 3 in 6 months and I will see him back in 6 months   Visit Diagnosis 1. Porphyria cutanea tarda ([place])   2. Macrocytic anemia     Nelwyn Salisbury PA-C [place] [facility] 11/22/2021 10:13 AM

## 2022-02-05 ENCOUNTER — Ambulatory Visit: Payer: Medicare Other | Admitting: Dermatology

## 2022-02-05 ENCOUNTER — Telehealth: Payer: Self-pay | Admitting: *Deleted

## 2022-02-05 DIAGNOSIS — L578 Other skin changes due to chronic exposure to nonionizing radiation: Secondary | ICD-10-CM

## 2022-02-05 DIAGNOSIS — L720 Epidermal cyst: Secondary | ICD-10-CM

## 2022-02-05 DIAGNOSIS — L570 Actinic keratosis: Secondary | ICD-10-CM

## 2022-02-05 DIAGNOSIS — L821 Other seborrheic keratosis: Secondary | ICD-10-CM | POA: Diagnosis not present

## 2022-02-05 DIAGNOSIS — D229 Melanocytic nevi, unspecified: Secondary | ICD-10-CM | POA: Diagnosis not present

## 2022-02-05 MED ORDER — FLUOROURACIL 5 % EX CREA: TOPICAL_CREAM | CUTANEOUS | 0 refills | Status: AC

## 2022-02-05 NOTE — Patient Instructions (Addendum)
- Start 5-fluorouracil cream. Prescription sent to [facility].   Apply cream to nose, affected areas of face for twice daily for 1 week  Apply cream to affected areas of left arm twice daily for 2 weeks.     5-Fluorouracil/Calcipotriene Patient Education   Actinic keratoses are the dry, red scaly spots on the skin caused by sun damage. A portion of these spots can turn into skin cancer with time, and treating them can help prevent development of skin cancer.   Treatment of these spots requires removal of the defective skin cells. There are various ways to remove actinic keratoses, including freezing with liquid nitrogen, treatment with creams, or treatment with a blue light procedure in the office.   5-fluorouracil cream is a topical cream used to treat actinic keratoses. It works by interfering with the growth of abnormal fast-growing skin cells, such as actinic keratoses. These cells peel off and are replaced by healthy ones.   5-fluorouracil/calcipotriene is a combination of the 5-fluorouracil cream with a vitamin D analog cream called calcipotriene. The calcipotriene alone does not treat actinic keratoses. However, when it is combined with 5-fluorouracil, it helps the 5-fluorouracil treat the actinic keratoses much faster so that the same results can be achieved with a much shorter treatment time.  INSTRUCTIONS FOR 5-FLUOROURACIL/CALCIPOTRIENE CREAM:   5-fluorouracil/calcipotriene cream typically only needs to be used for 4-7 days. A thin layer should be applied twice a day to the treatment areas recommended by your physician.   If your physician prescribed you separate tubes of 5-fluourouracil and calcipotriene, apply a thin layer of 5-fluorouracil followed by a thin layer of calcipotriene.   Avoid contact with your eyes, nostrils, and mouth. Do not use 5-fluorouracil/calcipotriene cream on infected or open wounds.   You will develop redness, irritation and some crusting at areas  where you have pre-cancer damage/actinic keratoses. IF YOU DEVELOP PAIN, BLEEDING, OR SIGNIFICANT CRUSTING, STOP THE TREATMENT EARLY - you have already gotten a good response and the actinic keratoses should clear up well.  Wash your hands after applying 5-fluorouracil 5% cream on your skin.   A moisturizer or sunscreen with a minimum SPF 30 should be applied each morning.   Once you have finished the treatment, you can apply a thin layer of Vaseline twice a day to irritated areas to soothe and calm the areas more quickly. If you experience significant discomfort, contact your physician.  For some patients it is necessary to repeat the treatment for best results.  SIDE EFFECTS: When using 5-fluorouracil/calcipotriene cream, you may have mild irritation, such as redness, dryness, swelling, or a mild burning sensation. This usually resolves within 2 weeks. The more actinic keratoses you have, the more redness and inflammation you can expect during treatment. Eye irritation has been reported rarely. If this occurs, please let us know.  If you have any trouble using this cream, please call the office. If you have any other questions about this information, please do not hesitate to ask me before you leave the office.         Seborrheic Keratosis  What causes seborrheic keratoses? Seborrheic keratoses are harmless, common skin growths that first appear during adult life.  As time goes by, more growths appear.  Some people may develop a large number of them.  Seborrheic keratoses appear on both covered and uncovered body parts.  They are not caused by sunlight.  The tendency to develop seborrheic keratoses can be inherited.  They vary in color from skin-colored to  gray, brown, or even black.  They can be either smooth or have a rough, warty surface.   Seborrheic keratoses are superficial and look as if they were stuck on the skin.  Under the microscope this type of keratosis looks like layers upon  layers of skin.  That is why at times the top layer may seem to fall off, but the rest of the growth remains and re-grows.    Treatment Seborrheic keratoses do not need to be treated, but can easily be removed in the office.  Seborrheic keratoses often cause symptoms when they rub on clothing or jewelry.  Lesions can be in the way of shaving.  If they become inflamed, they can cause itching, soreness, or burning.  Removal of a seborrheic keratosis can be accomplished by freezing, burning, or surgery. If any spot bleeds, scabs, or grows rapidly, please return to have it checked, as these can be an indication of a skin cancer.     Actinic keratoses are precancerous spots that appear secondary to cumulative UV radiation exposure/sun exposure over time. They are chronic with expected duration over 1 year. A portion of actinic keratoses will progress to squamous cell carcinoma of the skin. It is not possible to reliably predict which spots will progress to skin cancer and so treatment is recommended to prevent development of skin cancer.  Recommend daily broad spectrum sunscreen SPF 30+ to sun-exposed areas, reapply every 2 hours as needed.  Recommend staying in the shade or wearing long sleeves, sun glasses (UVA+UVB protection) and wide brim hats (4-inch brim around the entire circumference of the hat). Call for new or changing lesions.     Cryotherapy Aftercare  Wash gently with soap and water everyday.   Apply Vaseline and Band-Aid daily until healed.   Melanoma ABCDEs  Melanoma is the most dangerous type of skin cancer, and is the leading cause of death from skin disease.  You are more likely to develop melanoma if you: Have light-colored skin, light-colored eyes, or red or blond hair Spend a lot of time in the sun Tan regularly, either outdoors or in a tanning bed Have had blistering sunburns, especially during childhood Have a close family member who has had a melanoma Have atypical  moles or large birthmarks  Early detection of melanoma is key since treatment is typically straightforward and cure rates are extremely high if we catch it early.   The first sign of melanoma is often a change in a mole or a new dark spot.  The ABCDE system is a way of remembering the signs of melanoma.  A for asymmetry:  The two halves do not match. B for border:  The edges of the growth are irregular. C for color:  A mixture of colors are present instead of an even brown color. D for diameter:  Melanomas are usually (but not always) greater than 66m - the size of a pencil eraser. E for evolution:  The spot keeps changing in size, shape, and color.  Please check your skin once per month between visits. You can use a small mirror in front and a large mirror behind you to keep an eye on the back side or your body.   If you see any new or changing lesions before your next follow-up, please call to schedule a visit.  Please continue daily skin protection including broad spectrum sunscreen SPF 30+ to sun-exposed areas, reapplying every 2 hours as needed when you're outdoors.   Staying in the  shade or wearing long sleeves, sun glasses (UVA+UVB protection) and wide brim hats (4-inch brim around the entire circumference of the hat) are also recommended for sun protection.       Due to recent changes in healthcare laws, you may see results of your pathology and/or laboratory studies on MyChart before the doctors have had a chance to review them. We understand that in some cases there may be results that are confusing or concerning to you. Please understand that not all results are received at the same time and often the doctors may need to interpret multiple results in order to provide you with the best plan of care or course of treatment. Therefore, we ask that you please give [place] 2 business days to thoroughly review all your results before contacting the office for clarification. Should we see a  critical lab result, you will be contacted sooner.   If You Need Anything After Your Visit  If you have any questions or concerns for your doctor, please call our main line at [phone] and press option 4 to reach your doctor's medical assistant. If no one answers, please leave a voicemail as directed and we will return your call as soon as possible. Messages left after 4 pm will be answered the following business day.   You may also send [place] a message via [place]. We typically respond to MyChart messages within 1-2 business days.  For prescription refills, please ask your pharmacy to contact our office. Our fax number is [phone].  If you have an urgent issue when the clinic is closed that cannot wait until the next business day, you can page your doctor at the number below.    Please note that while we do our best to be available for urgent issues outside of office hours, we are not available 24/7.   If you have an urgent issue and are unable to reach [place], you may choose to seek medical care at your doctor's office, retail clinic, urgent care center, or emergency room.  If you have a medical emergency, please immediately call 911 or go to the emergency department.  Pager Numbers  - Dr. Nehemiah Massed: [phone]  - Dr. Laurence Ferrari: [phone]  - Dr. Nicole Kindred: [phone]  In the event of inclement weather, please call our main line at [phone] for an update on the status of any delays or closures.  Dermatology Medication Tips: Please keep the boxes that topical medications come in in order to help keep track of the instructions about where and how to use these. Pharmacies typically print the medication instructions only on the boxes and not directly on the medication tubes.   If your medication is too expensive, please contact our office at [phone] option 4 or send [place] a message through [place].   We are unable to tell what your co-pay for medications will be in advance as  this is different depending on your insurance coverage. However, we may be able to find a substitute medication at lower cost or fill out paperwork to get insurance to cover a needed medication.   If a prior authorization is required to get your medication covered by your insurance company, please allow [place] 1-2 business days to complete this process.  Drug prices often vary depending on where the prescription is filled and some pharmacies may offer cheaper prices.  The website www.goodrx.com contains coupons for medications through different pharmacies. The prices here do not account for what the cost may be with help from  insurance (it may be cheaper with your insurance), but the website can give you the price if you did not use any insurance.  - You can print the associated coupon and take it with your prescription to the pharmacy.  - You may also stop by our office during regular business hours and pick up a GoodRx coupon card.  - If you need your prescription sent electronically to a different pharmacy, notify our office through [facility] or by phone at [phone] option 4.     Si Usted Necesita Algo Despus de Su Visita  Tambin puede enviarnos un mensaje a travs de Pharmacist, community. Por lo general respondemos a los mensajes de MyChart en el transcurso de 1 a 2 das hbiles.  Para renovar recetas, por favor pida a su farmacia que se ponga en contacto con nuestra oficina. Harland Dingwall de fax es [place] [phone].  Si tiene un asunto urgente cuando la clnica est cerrada y que no puede esperar hasta el siguiente da hbil, puede llamar/localizar a su doctor(a) al nmero que aparece a continuacin.   Por favor, tenga en cuenta que aunque hacemos todo lo posible para estar disponibles para asuntos urgentes fuera del horario de [place], no estamos disponibles las 24 horas del da, los 7 das de la [place].   Si tiene un problema urgente y no puede comunicarse con nosotros, puede optar por  buscar atencin mdica  en el consultorio de su doctor(a), en una clnica privada, en un centro de atencin urgente o en una sala de emergencias.  Si tiene Engineering geologist, por favor llame inmediatamente al 911 o vaya a la sala de emergencias.  Nmeros de bper  - Dr. Nehemiah Massed: [phone]  - Dra. Moye: [phone]  - Dra. Nicole Kindred: [phone]  En caso de inclemencias del [place], por favor llame a Johnsie Kindred principal al [phone] para una actualizacin sobre el [place] de cualquier retraso o cierre.  Consejos para la medicacin en dermatologa: Por favor, guarde las cajas en las que vienen los medicamentos de uso tpico para ayudarle a seguir las instrucciones sobre dnde y cmo usarlos. Las farmacias generalmente imprimen las instrucciones del medicamento slo en las cajas y no directamente en los tubos del [place].   Si su medicamento es muy caro, por favor, pngase en contacto con Zigmund Daniel llamando al [phone] y presione la opcin 4 o envenos un mensaje a travs de Pharmacist, community.   No podemos decirle cul ser su copago por los medicamentos por adelantado ya que esto es diferente dependiendo de la cobertura de su seguro. Sin embargo, es posible que podamos encontrar un medicamento sustituto a Electrical engineer un formulario para que el seguro cubra el medicamento que se considera necesario.   Si se requiere una autorizacin previa para que su compaa de seguros [place] su medicamento, por favor permtanos de 1 a 2 das hbiles para completar este proceso.  Los precios de los medicamentos varan con frecuencia dependiendo del Environmental consultant de dnde se surte la receta y alguna farmacias pueden ofrecer precios ms baratos.  El sitio web www.goodrx.com tiene cupones para medicamentos de Airline pilot. Los precios aqu no tienen en cuenta lo que podra costar con la ayuda del seguro (puede ser ms barato con su seguro), pero el sitio web puede darle el precio si no  utiliz Research scientist (physical sciences).  - Puede imprimir el cupn correspondiente y llevarlo con su receta a la farmacia.  - Tambin puede pasar por nuestra oficina durante el horario de  atencin regular y Charity fundraiser una tarjeta de cupones de GoodRx.  - Si necesita que su receta se enve electrnicamente a una farmacia diferente, informe a nuestra oficina a travs de MyChart de [place] o por telfono llamando al [phone] y presione la opcin 4.

## 2022-02-05 NOTE — Telephone Encounter (Signed)
The pt called to say that he saw NP and he never got a future appt. I spoke to Janese Banks and she said to get  labs this week and we will see if he needs a phlebotomy.it is Friday 9/15 9 am for labs Then next appt is December 15 at 10 am for labs and then see dr. Janese Banks on same day. If he needs phlebotomy we will have to call and arrange that with the pt. The pt wanted me to text him the appts and I did.

## 2022-02-05 NOTE — Progress Notes (Signed)
Follow-Up Visit   Subjective  Shawn Gonzalez is a 66 y.o. male who presents for the following: New Patient (Initial Visit) (Place on back of leg for a while he would like checked, spot at left side. Patient denies any personal or family history of skin cancer. Patient reports a mole on right or left side buttock he would like checked. ).  The following portions of the chart were reviewed this encounter and updated as appropriate:  Tobacco  Allergies  Meds  Problems  Med Hx  Surg Hx  Fam Hx      Review of Systems: No other skin or systemic complaints except as noted in HPI or Assessment and Plan.   Objective  Well appearing patient in no apparent distress; mood and affect are within normal limits.  All skin waist up examined. And Left popliteal   left forehead x 1, tip of nose x 1, left forearm x 2 (4) Erythematous thin papules/macules with gritty scale.   posterior left ear Subcutaneous nodule.    Assessment & Plan  Actinic keratosis (4) left forehead x 1, tip of nose x 1, left forearm x 2  Discussed treatment options   Patient prefers cream treatment   Start Fluorouracil cream apply topically to aa' of face bid for 1 week and bid for 2 weeks at left arm. Reviewed course of treatment and expected reaction.  Patient advised to expect inflammation and crusting and advised that erosions are possible.  Patient advised to be diligent with sun protection during and after treatment. Handout with details of how to apply medication and what to expect provided.   Actinic keratoses are precancerous spots that appear secondary to cumulative UV radiation exposure/sun exposure over time. They are chronic with expected duration over 1 year. A portion of actinic keratoses will progress to squamous cell carcinoma of the skin. It is not possible to reliably predict which spots will progress to skin cancer and so treatment is recommended to prevent development of skin cancer.  Recommend  daily broad spectrum sunscreen SPF 30+ to sun-exposed areas, reapply every 2 hours as needed.  Recommend staying in the shade or wearing long sleeves, sun glasses (UVA+UVB protection) and wide brim hats (4-inch brim around the entire circumference of the hat). Call for new or changing lesions.  fluorouracil (EFUDEX) 5 % cream - left forehead x 1, tip of nose x 1, left forearm x 2 Apply topically See admin instructions. Please use as directed. Refer to patient handout  Epidermal inclusion cyst posterior left ear  Benign-appearing. Exam most consistent with an epidermal inclusion cyst. Discussed that a cyst is a benign growth that can grow over time and sometimes get irritated or inflamed. Recommend observation if it is not bothersome. Discussed option of surgical excision to remove it if it is growing, symptomatic, or other changes noted. Please call for new or changing lesions so they can be evaluated.    Seborrheic Keratoses Lft flank , lt popliteal fossa  - Stuck-on, waxy, tan-brown papules and/or plaques  - Benign-appearing - Discussed benign etiology and prognosis. - Observe - Call for any changes  Melanocytic Nevi 0.9 cm pink papule with tan center symmetric soft  - Tan-brown and/or pink-flesh-colored symmetric macules and papules - Benign appearing on exam today - Observation - Call clinic for new or changing moles - Recommend daily use of broad spectrum spf 30+ sunscreen to sun-exposed areas.   Actinic Damage - chronic, secondary to cumulative UV radiation exposure/sun exposure over  time - diffuse scaly erythematous macules with underlying dyspigmentation - Recommend daily broad spectrum sunscreen SPF 30+ to sun-exposed areas, reapply every 2 hours as needed.  - Recommend staying in the shade or wearing long sleeves, sun glasses (UVA+UVB protection) and wide brim hats (4-inch brim around the entire circumference of the hat). - Call for new or changing lesions.  Return for  3 - 4 month tbse recheck aks. I, Ruthell Rummage, CMA, am acting as scribe for Forest Gleason, MD.  Documentation: I have reviewed the above documentation for accuracy and completeness, and I agree with the above.  Forest Gleason, MD

## 2022-02-06 ENCOUNTER — Other Ambulatory Visit: Payer: Self-pay | Admitting: *Deleted

## 2022-02-06 DIAGNOSIS — R7989 Other specified abnormal findings of blood chemistry: Secondary | ICD-10-CM

## 2022-02-07 ENCOUNTER — Inpatient Hospital Stay: Payer: Medicare Other | Attending: Oncology

## 2022-02-07 DIAGNOSIS — R7989 Other specified abnormal findings of blood chemistry: Secondary | ICD-10-CM

## 2022-02-07 LAB — CBC WITH DIFFERENTIAL/PLATELET
Abs Immature Granulocytes: 0.02 10*3/uL (ref 0.00–0.07)
Basophils Absolute: 0 10*3/uL (ref 0.0–0.1)
Basophils Relative: 0 %
Eosinophils Absolute: 0.2 10*3/uL (ref 0.0–0.5)
Eosinophils Relative: 4 %
HCT: 40.8 % (ref 39.0–52.0)
Hemoglobin: 14.5 g/dL (ref 13.0–17.0)
Immature Granulocytes: 0 %
Lymphocytes Relative: 18 %
Lymphs Abs: 1 10*3/uL (ref 0.7–4.0)
MCH: 37.2 pg — ABNORMAL HIGH (ref 26.0–34.0)
MCHC: 35.5 g/dL (ref 30.0–36.0)
MCV: 104.6 fL — ABNORMAL HIGH (ref 80.0–100.0)
Monocytes Absolute: 0.5 10*3/uL (ref 0.1–1.0)
Monocytes Relative: 9 %
Neutro Abs: 4 10*3/uL (ref 1.7–7.7)
Neutrophils Relative %: 69 %
Platelets: 177 10*3/uL (ref 150–400)
RBC: 3.9 MIL/uL — ABNORMAL LOW (ref 4.22–5.81)
RDW: 12.6 % (ref 11.5–15.5)
WBC: 5.7 10*3/uL (ref 4.0–10.5)
nRBC: 0 % (ref 0.0–0.2)

## 2022-02-07 LAB — COMPREHENSIVE METABOLIC PANEL
ALT: 13 U/L (ref 0–44)
AST: 17 U/L (ref 15–41)
Albumin: 4.3 g/dL (ref 3.5–5.0)
Alkaline Phosphatase: 58 U/L (ref 38–126)
Anion gap: 7 (ref 5–15)
BUN: 10 mg/dL (ref 8–23)
CO2: 25 mmol/L (ref 22–32)
Calcium: 9.2 mg/dL (ref 8.9–10.3)
Chloride: 101 mmol/L (ref 98–111)
Creatinine, Ser: 0.75 mg/dL (ref 0.61–1.24)
GFR, Estimated: >60 mL/min (ref >60)
Glucose, Bld: 104 mg/dL — ABNORMAL HIGH (ref 70–99)
Potassium: 4.2 mmol/L (ref 3.5–5.1)
Sodium: 133 mmol/L — ABNORMAL LOW (ref 135–145)
Total Bilirubin: 0.8 mg/dL (ref 0.3–1.2)
Total Protein: 7.6 g/dL (ref 6.5–8.1)

## 2022-02-07 LAB — FERRITIN: Ferritin: 157 ng/mL (ref 24–336)

## 2022-02-12 ENCOUNTER — Inpatient Hospital Stay: Payer: Medicare Other

## 2022-02-12 DIAGNOSIS — R7989 Other specified abnormal findings of blood chemistry: Secondary | ICD-10-CM

## 2022-02-12 NOTE — Progress Notes (Signed)
Patient tolatered phlebotomy well today, performed in RAC using 20g angiocath. 250 cc removed as ordered. Ferritin 157. No concerns voiced. Snack and po fluids offered/refused. Stable at discharge. AVS given.

## 2022-02-12 NOTE — Patient Instructions (Signed)

## 2022-02-13 ENCOUNTER — Encounter: Payer: Self-pay | Admitting: Dermatology

## 2022-02-18 ENCOUNTER — Other Ambulatory Visit: Payer: Self-pay | Admitting: *Deleted

## 2022-02-18 DIAGNOSIS — R7989 Other specified abnormal findings of blood chemistry: Secondary | ICD-10-CM

## 2022-02-19 ENCOUNTER — Inpatient Hospital Stay: Payer: Medicare Other

## 2022-02-27 ENCOUNTER — Inpatient Hospital Stay: Payer: Medicare Other | Attending: Oncology

## 2022-02-27 ENCOUNTER — Inpatient Hospital Stay: Payer: Medicare Other

## 2022-02-27 VITALS — BP 113/72 | HR 65 | Temp 97.6°F | Resp 17

## 2022-02-27 DIAGNOSIS — R7989 Other specified abnormal findings of blood chemistry: Secondary | ICD-10-CM

## 2022-02-27 LAB — HEMATOCRIT: HCT: 38.3 % — ABNORMAL LOW (ref 39.0–52.0)

## 2022-02-27 LAB — HEMOGLOBIN: Hemoglobin: 13.9 g/dL (ref 13.0–17.0)

## 2022-02-27 LAB — FERRITIN: Ferritin: 156 ng/mL (ref 24–336)

## 2022-02-27 NOTE — Progress Notes (Signed)
Removed 250 ml of blood per phlebotomy parameters from left upper arm. Pt tolerated well. Accepted a beverage. VSS. Discharged to home.

## 2022-02-27 NOTE — Patient Instructions (Signed)

## 2022-03-06 ENCOUNTER — Other Ambulatory Visit: Payer: Self-pay

## 2022-03-06 ENCOUNTER — Inpatient Hospital Stay: Payer: Medicare Other

## 2022-03-06 VITALS — BP 119/69 | HR 66 | Temp 97.7°F | Resp 16

## 2022-03-06 DIAGNOSIS — D696 Thrombocytopenia, unspecified: Secondary | ICD-10-CM

## 2022-03-06 DIAGNOSIS — R7989 Other specified abnormal findings of blood chemistry: Secondary | ICD-10-CM

## 2022-03-06 LAB — COMPREHENSIVE METABOLIC PANEL
ALT: 11 U/L (ref 0–44)
AST: 16 U/L (ref 15–41)
Albumin: 4.2 g/dL (ref 3.5–5.0)
Alkaline Phosphatase: 57 U/L (ref 38–126)
Anion gap: 8 (ref 5–15)
BUN: 10 mg/dL (ref 8–23)
CO2: 22 mmol/L (ref 22–32)
Calcium: 9.3 mg/dL (ref 8.9–10.3)
Chloride: 102 mmol/L (ref 98–111)
Creatinine, Ser: 0.84 mg/dL (ref 0.61–1.24)
GFR, Estimated: >60 mL/min (ref >60)
Glucose, Bld: 99 mg/dL (ref 70–99)
Potassium: 4 mmol/L (ref 3.5–5.1)
Sodium: 132 mmol/L — ABNORMAL LOW (ref 135–145)
Total Bilirubin: 0.5 mg/dL (ref 0.3–1.2)
Total Protein: 7.3 g/dL (ref 6.5–8.1)

## 2022-03-06 LAB — CBC WITH DIFFERENTIAL/PLATELET
Abs Immature Granulocytes: 0.01 10*3/uL (ref 0.00–0.07)
Basophils Absolute: 0 10*3/uL (ref 0.0–0.1)
Basophils Relative: 1 %
Eosinophils Absolute: 0.1 10*3/uL (ref 0.0–0.5)
Eosinophils Relative: 3 %
HCT: 37.5 % — ABNORMAL LOW (ref 39.0–52.0)
Hemoglobin: 13.5 g/dL (ref 13.0–17.0)
Immature Granulocytes: 0 %
Lymphocytes Relative: 22 %
Lymphs Abs: 1 10*3/uL (ref 0.7–4.0)
MCH: 37 pg — ABNORMAL HIGH (ref 26.0–34.0)
MCHC: 36 g/dL (ref 30.0–36.0)
MCV: 102.7 fL — ABNORMAL HIGH (ref 80.0–100.0)
Monocytes Absolute: 0.6 10*3/uL (ref 0.1–1.0)
Monocytes Relative: 13 %
Neutro Abs: 2.7 10*3/uL (ref 1.7–7.7)
Neutrophils Relative %: 61 %
Platelets: 201 10*3/uL (ref 150–400)
RBC: 3.65 MIL/uL — ABNORMAL LOW (ref 4.22–5.81)
RDW: 12.2 % (ref 11.5–15.5)
WBC: 4.4 10*3/uL (ref 4.0–10.5)
nRBC: 0 % (ref 0.0–0.2)

## 2022-03-06 LAB — FERRITIN: Ferritin: 125 ng/mL (ref 24–336)

## 2022-03-06 LAB — IRON AND TIBC
Iron: 104 ug/dL (ref 45–182)
Saturation Ratios: 31 % (ref 17.9–39.5)
TIBC: 337 ug/dL (ref 250–450)
UIBC: 233 ug/dL

## 2022-03-06 LAB — VITAMIN B12: Vitamin B-12: 297 pg/mL (ref 180–914)

## 2022-03-06 NOTE — Progress Notes (Signed)
Performed Therapeutic phlebotomy today. Removed 250 ml of blood. Pt tolerated procedure well. Accepted a beverage. VSS. Feeling well at time of discharge.

## 2022-03-06 NOTE — Patient Instructions (Signed)

## 2022-03-14 ENCOUNTER — Inpatient Hospital Stay: Payer: Medicare Other

## 2022-03-14 VITALS — BP 116/76 | HR 69 | Temp 98.7°F | Resp 16

## 2022-03-14 DIAGNOSIS — R7989 Other specified abnormal findings of blood chemistry: Secondary | ICD-10-CM

## 2022-03-14 LAB — HEMOGLOBIN AND HEMATOCRIT, BLOOD
HCT: 39.6 % (ref 39.0–52.0)
Hemoglobin: 13.8 g/dL (ref 13.0–17.0)

## 2022-03-14 NOTE — Progress Notes (Signed)
Performed 250 ml phlebotomy from R AC. Pt tolerated procedure well. Accepted a beverage. VSS. Feeling well at time of discharge.

## 2022-03-14 NOTE — Patient Instructions (Signed)

## 2022-03-20 ENCOUNTER — Other Ambulatory Visit: Payer: Self-pay

## 2022-03-20 ENCOUNTER — Inpatient Hospital Stay: Payer: Medicare Other

## 2022-03-20 ENCOUNTER — Other Ambulatory Visit: Payer: Self-pay | Admitting: *Deleted

## 2022-03-20 VITALS — BP 137/82 | HR 60 | Temp 98.8°F

## 2022-03-20 DIAGNOSIS — R7989 Other specified abnormal findings of blood chemistry: Secondary | ICD-10-CM

## 2022-03-20 LAB — HEMOGLOBIN AND HEMATOCRIT, BLOOD
HCT: 39.6 % (ref 39.0–52.0)
Hemoglobin: 13.6 g/dL (ref 13.0–17.0)

## 2022-03-20 LAB — FERRITIN: Ferritin: 57 ng/mL (ref 24–336)

## 2022-03-20 NOTE — Patient Instructions (Signed)

## 2022-03-20 NOTE — Progress Notes (Signed)
Removed 250 ml of blood from left AC 20 g angiocath. Pt tolerated procedure well. VSS. Discharged to home feeling well.

## 2022-03-28 ENCOUNTER — Inpatient Hospital Stay: Payer: Medicare Other

## 2022-04-04 ENCOUNTER — Other Ambulatory Visit: Payer: Medicare Other

## 2022-04-04 ENCOUNTER — Inpatient Hospital Stay: Payer: Medicare Other

## 2022-05-09 ENCOUNTER — Inpatient Hospital Stay: Payer: Medicare Other | Attending: Oncology

## 2022-05-09 ENCOUNTER — Encounter: Payer: Self-pay | Admitting: Oncology

## 2022-05-09 ENCOUNTER — Other Ambulatory Visit: Payer: Self-pay

## 2022-05-09 ENCOUNTER — Inpatient Hospital Stay (HOSPITAL_BASED_OUTPATIENT_CLINIC_OR_DEPARTMENT_OTHER): Payer: Medicare Other | Admitting: Oncology

## 2022-05-09 DIAGNOSIS — Z79899 Other long term (current) drug therapy: Secondary | ICD-10-CM | POA: Diagnosis not present

## 2022-05-09 DIAGNOSIS — E538 Deficiency of other specified B group vitamins: Secondary | ICD-10-CM | POA: Insufficient documentation

## 2022-05-09 DIAGNOSIS — D539 Nutritional anemia, unspecified: Secondary | ICD-10-CM

## 2022-05-09 LAB — COMPREHENSIVE METABOLIC PANEL
ALT: 11 U/L (ref 0–44)
AST: 17 U/L (ref 15–41)
Albumin: 4.2 g/dL (ref 3.5–5.0)
Alkaline Phosphatase: 66 U/L (ref 38–126)
Anion gap: 9 (ref 5–15)
BUN: 9 mg/dL (ref 8–23)
CO2: 23 mmol/L (ref 22–32)
Calcium: 9 mg/dL (ref 8.9–10.3)
Chloride: 99 mmol/L (ref 98–111)
Creatinine, Ser: 0.89 mg/dL (ref 0.61–1.24)
GFR, Estimated: >60 mL/min (ref >60)
Glucose, Bld: 101 mg/dL — ABNORMAL HIGH (ref 70–99)
Potassium: 3.8 mmol/L (ref 3.5–5.1)
Sodium: 131 mmol/L — ABNORMAL LOW (ref 135–145)
Total Bilirubin: 0.5 mg/dL (ref 0.3–1.2)
Total Protein: 7.3 g/dL (ref 6.5–8.1)

## 2022-05-09 LAB — CBC WITH DIFFERENTIAL/PLATELET
Abs Immature Granulocytes: 0.01 10*3/uL (ref 0.00–0.07)
Basophils Absolute: 0 10*3/uL (ref 0.0–0.1)
Basophils Relative: 1 %
Eosinophils Absolute: 0.2 10*3/uL (ref 0.0–0.5)
Eosinophils Relative: 4 %
HCT: 43.9 % (ref 39.0–52.0)
Hemoglobin: 15.3 g/dL (ref 13.0–17.0)
Immature Granulocytes: 0 %
Lymphocytes Relative: 21 %
Lymphs Abs: 1 10*3/uL (ref 0.7–4.0)
MCH: 34.9 pg — ABNORMAL HIGH (ref 26.0–34.0)
MCHC: 34.9 g/dL (ref 30.0–36.0)
MCV: 100 fL (ref 80.0–100.0)
Monocytes Absolute: 0.4 10*3/uL (ref 0.1–1.0)
Monocytes Relative: 9 %
Neutro Abs: 3.2 10*3/uL (ref 1.7–7.7)
Neutrophils Relative %: 65 %
Platelets: 191 10*3/uL (ref 150–400)
RBC: 4.39 MIL/uL (ref 4.22–5.81)
RDW: 11.7 % (ref 11.5–15.5)
WBC: 4.9 10*3/uL (ref 4.0–10.5)
nRBC: 0 % (ref 0.0–0.2)

## 2022-05-09 LAB — IRON AND TIBC
Iron: 83 ug/dL (ref 45–182)
Saturation Ratios: 21 % (ref 17.9–39.5)
TIBC: 395 ug/dL (ref 250–450)
UIBC: 312 ug/dL

## 2022-05-09 LAB — FERRITIN: Ferritin: 42 ng/mL (ref 24–336)

## 2022-05-09 LAB — VITAMIN B12: Vitamin B-12: 306 pg/mL (ref 180–914)

## 2022-05-09 MED ORDER — CYANOCOBALAMIN 1000 MCG/ML IJ SOLN: 1000.0000 ug | INTRAMUSCULAR | 0 refills | Status: DC

## 2022-05-09 NOTE — Progress Notes (Signed)
Hematology/Oncology Consult note [facility]  [phone] [phone]  Patient Care Team: Idelle Crouch, MD as PCP - General (Internal Medicine) Sindy Guadeloupe, MD as Consulting Physician (Hematology and Oncology)   Name of the patient: Shawn Gonzalez  [id number]  05-29-1955   Date of visit: 05/09/22  Diagnosis-porphyria cutanea tarda  Chief complaint/ Reason for visit-routine follow-up of porphyria cutanea tarda for possible phlebotomy  Heme/Onc history: Patient is a 66 year old male with a past medical history significant for hypertension, anxiety, chronic back pain and tobacco dependence.  He has had on and off blistering of his bilateral hands and has seen dermatology in the past and was attributed to hematological disorder.  Patient states that he has came to the cancer center many years ago and underwent phlebotomy at that time.  He is unsure if he has hemochromatosis.  Family history of any liver disease.  He does report drinking alcohol sometimes he may have a few beers and 1 go sometimes he may go for days without drink.  He also smokes 1 pack of cigarettes per day for the last 42 years.   Results of blood work from 12/10/2018 showed elevated ferritin of 772.  HIV and hepatitis B and C testing was negative.  HFE gene testing was negative.   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  He drinks a couple of beers every day  ECOG PS-  1 Pain scale- 0   Review of systems- Review of Systems  Constitutional:  Positive for malaise/fatigue. Negative for chills, fever and weight loss.  HENT:  Negative for congestion, ear discharge and nosebleeds.   Eyes:  Negative for blurred vision.  Respiratory:  Negative for cough, hemoptysis, sputum production, shortness of breath and wheezing.   Cardiovascular:  Negative for chest pain, palpitations, orthopnea and claudication.  Gastrointestinal:  Negative for abdominal pain, blood in stool, constipation, diarrhea, heartburn, melena, nausea and vomiting.  Genitourinary:  Negative for dysuria, flank pain, frequency, hematuria and urgency.  Musculoskeletal:  Negative for back pain, joint pain and myalgias.  Skin:  Negative for rash.  Neurological:  Negative for dizziness, tingling, focal weakness, seizures, weakness and headaches.  Endo/Heme/Allergies:  Does not bruise/bleed easily.  Psychiatric/Behavioral:  Negative for depression and suicidal ideas. The patient does not have insomnia.       Allergies  Allergen Reactions   Celebrex [Celecoxib] Nausea Only     Past Medical History:  Diagnosis Date   Anxiety    Chronic low back pain    Depression    Elevated ferritin    Erectile dysfunction    History of chicken pox    Hypertension    Insomnia    Porphyria cutanea tarda ([place])      Past Surgical History:  Procedure Laterality Date   COLONOSCOPY WITH PROPOFOL N/A 10/31/2016   Procedure: COLONOSCOPY WITH PROPOFOL;  Surgeon: Gaylyn Cheers  T, MD;  Location: [facility] ENDOSCOPY;  Service: Endoscopy;  Laterality: N/A;   FINGER SURGERY Right    RT.INDEX   HERNIA REPAIR     MULTIPLE TOOTH EXTRACTIONS     XI ROBOTIC ASSISTED INGUINAL HERNIA REPAIR WITH MESH Left 09/27/2020   Procedure: XI ROBOTIC ASSISTED INGUINAL HERNIA REPAIR WITH MESH;  Surgeon: Benjamine Sprague, DO;  Location: [facility] ORS;  Service: General;  Laterality: Left;    Social History   Socioeconomic History   Marital  status: Married    Spouse name: Not on file   Number of children: Not on file   Years of education: Not on file   Highest education level: Not on file  Occupational History   Not on file  Tobacco Use   Smoking status: Every Day    Packs/day: 1.00    Years: 42.00    Total pack years: 42.00    Types: Cigarettes   Smokeless tobacco: Never  Vaping Use   Vaping Use: Never used  Substance and Sexual Activity   Alcohol use: Yes    Alcohol/week: 18.0 standard drinks of alcohol    Types: 18 Cans of beer per week   Drug use: No   Sexual activity: Not on file  Other Topics Concern   Not on file  Social History Narrative   Not on file   Social Determinants of Health   Financial Resource Strain: Not on file  Food Insecurity: Not on file  Transportation Needs: Not on file  Physical Activity: Not on file  Stress: Not on file  Social Connections: Not on file  Intimate Partner Violence: Not on file    History reviewed. No pertinent family history.   Current Outpatient Medications:    amLODipine (NORVASC) 10 MG tablet, Take 1 tablet by mouth daily., Disp: , Rfl:    bisoprolol-hydrochlorothiazide (ZIAC) 5-6.25 MG tablet, Take 1 tablet by mouth daily., Disp: , Rfl:    cyclobenzaprine (FLEXERIL) 10 MG tablet, Take 1 tablet by mouth 3 (three) times daily as needed., Disp: , Rfl:    fluorouracil (EFUDEX) 5 % cream, Apply topically See admin instructions. Please use as directed. Refer to patient handout, Disp: 40 g, Rfl: 0   HYDROcodone-acetaminophen (NORCO) 5-325 MG tablet, Take 1 tablet by mouth every 6 (six) hours as needed for up to 6 doses for moderate pain., Disp: 6 tablet, Rfl: 0   lisinopril (PRINIVIL,ZESTRIL) 40 MG tablet, Take 40 mg by mouth daily., Disp: , Rfl:    Syringe/Needle, Disp, (SYRINGE 3CC/25GX1") 25G X 1" 3 ML MISC, 3 mLs by Does not apply route every 30 (thirty) days., Disp: 12 each, Rfl: 0   cyanocobalamin (VITAMIN B12) 1000 MCG/ML injection, Inject 1 mL (1,000 mcg  total) into the muscle every 30 (thirty) days., Disp: 12 mL, Rfl: 0  Physical exam:  Vitals:   05/09/22 1049  BP: 133/81  Pulse: 65  Temp: 99.1 F (37.3 C)  TempSrc: Tympanic  Weight: 157 lb 11.2 oz (71.5 kg)  Height: '5\' 8"'$  (1.727 m)   Physical Exam Cardiovascular:     Rate and Rhythm: Normal rate and regular rhythm.     Heart sounds: Normal heart sounds.  Pulmonary:     Effort: Pulmonary effort is normal.     Breath sounds: Normal breath sounds.  Abdominal:     General: Bowel sounds are normal.     Palpations: Abdomen is soft.  Skin:    General: Skin is warm and dry.  Neurological:  Mental Status: He is alert and oriented to person, place, and time.         Latest Ref Rng & Units 05/09/2022   10:40 AM  CMP  Glucose 70 - 99 mg/dL 101   BUN 8 - 23 mg/dL 9   Creatinine 0.61 - 1.24 mg/dL 0.89   Sodium 135 - 145 mmol/L 131   Potassium 3.5 - 5.1 mmol/L 3.8   Chloride 98 - 111 mmol/L 99   CO2 22 - 32 mmol/L 23   Calcium 8.9 - 10.3 mg/dL 9.0   Total Protein 6.5 - 8.1 g/dL 7.3   Total Bilirubin 0.3 - 1.2 mg/dL 0.5   Alkaline Phos 38 - 126 U/L 66   AST 15 - 41 U/L 17   ALT 0 - 44 U/L 11       Latest Ref Rng & Units 05/09/2022   10:40 AM  CBC  WBC 4.0 - 10.5 K/uL 4.9   Hemoglobin 13.0 - 17.0 g/dL 15.3   Hematocrit 39.0 - 52.0 % 43.9   Platelets 150 - 400 K/uL 191      Assessment and plan- Patient is a 66 y.o. male with history of porphyria cutanea tarda here for a possible phlebotomy  CBC is normal and ferritin levels are less than 100.  He does not require phlebotomy atThis time.  Will repeat CBC CMP and ferritin in 3 and 6 months and I will see him back in 6 months.  History of B12 deficiency.  He is on monthly B12 injections which she will continue   Visit Diagnosis 1. Porphyria cutanea tarda ([place])   2. B12 deficiency      Dr. Randa Evens, MD, MPH [facility] [phone] 05/09/2022 4:22 PM

## 2022-05-14 ENCOUNTER — Encounter: Payer: Medicare Other | Admitting: Dermatology

## 2022-08-08 ENCOUNTER — Inpatient Hospital Stay: Payer: Medicare Other | Attending: Oncology

## 2022-08-08 DIAGNOSIS — E538 Deficiency of other specified B group vitamins: Secondary | ICD-10-CM

## 2022-08-08 LAB — CBC WITH DIFFERENTIAL/PLATELET
Abs Immature Granulocytes: 0.02 10*3/uL (ref 0.00–0.07)
Basophils Absolute: 0 10*3/uL (ref 0.0–0.1)
Basophils Relative: 1 %
Eosinophils Absolute: 0.1 10*3/uL (ref 0.0–0.5)
Eosinophils Relative: 4 %
HCT: 41 % (ref 39.0–52.0)
Hemoglobin: 14.2 g/dL (ref 13.0–17.0)
Immature Granulocytes: 1 %
Lymphocytes Relative: 22 %
Lymphs Abs: 0.8 10*3/uL (ref 0.7–4.0)
MCH: 35.3 pg — ABNORMAL HIGH (ref 26.0–34.0)
MCHC: 34.6 g/dL (ref 30.0–36.0)
MCV: 102 fL — ABNORMAL HIGH (ref 80.0–100.0)
Monocytes Absolute: 0.4 10*3/uL (ref 0.1–1.0)
Monocytes Relative: 11 %
Neutro Abs: 2.2 10*3/uL (ref 1.7–7.7)
Neutrophils Relative %: 61 %
Platelets: 177 10*3/uL (ref 150–400)
RBC: 4.02 MIL/uL — ABNORMAL LOW (ref 4.22–5.81)
RDW: 13.7 % (ref 11.5–15.5)
WBC: 3.6 10*3/uL — ABNORMAL LOW (ref 4.0–10.5)
nRBC: 0 % (ref 0.0–0.2)

## 2022-08-08 LAB — COMPREHENSIVE METABOLIC PANEL
ALT: 14 U/L (ref 0–44)
AST: 18 U/L (ref 15–41)
Albumin: 4.1 g/dL (ref 3.5–5.0)
Alkaline Phosphatase: 57 U/L (ref 38–126)
Anion gap: 6 (ref 5–15)
BUN: 10 mg/dL (ref 8–23)
CO2: 24 mmol/L (ref 22–32)
Calcium: 8.7 mg/dL — ABNORMAL LOW (ref 8.9–10.3)
Chloride: 99 mmol/L (ref 98–111)
Creatinine, Ser: 0.86 mg/dL (ref 0.61–1.24)
GFR, Estimated: >60 mL/min (ref >60)
Glucose, Bld: 124 mg/dL — ABNORMAL HIGH (ref 70–99)
Potassium: 4 mmol/L (ref 3.5–5.1)
Sodium: 129 mmol/L — ABNORMAL LOW (ref 135–145)
Total Bilirubin: 0.5 mg/dL (ref 0.3–1.2)
Total Protein: 7.3 g/dL (ref 6.5–8.1)

## 2022-08-08 LAB — FERRITIN: Ferritin: 99 ng/mL (ref 24–336)

## 2022-11-10 ENCOUNTER — Other Ambulatory Visit: Payer: Self-pay | Admitting: *Deleted

## 2022-11-10 ENCOUNTER — Inpatient Hospital Stay: Payer: Medicare Other | Attending: Oncology | Admitting: Oncology

## 2022-11-10 ENCOUNTER — Encounter: Payer: Self-pay | Admitting: Oncology

## 2022-11-10 ENCOUNTER — Inpatient Hospital Stay: Payer: Medicare Other

## 2022-11-10 VITALS — BP 122/69 | HR 64 | Temp 99.1°F | Resp 18 | Ht 68.0 in | Wt 156.7 lb

## 2022-11-10 DIAGNOSIS — D696 Thrombocytopenia, unspecified: Secondary | ICD-10-CM | POA: Insufficient documentation

## 2022-11-10 DIAGNOSIS — F17210 Nicotine dependence, cigarettes, uncomplicated: Secondary | ICD-10-CM | POA: Insufficient documentation

## 2022-11-10 DIAGNOSIS — E538 Deficiency of other specified B group vitamins: Secondary | ICD-10-CM | POA: Diagnosis not present

## 2022-11-10 DIAGNOSIS — D539 Nutritional anemia, unspecified: Secondary | ICD-10-CM | POA: Diagnosis not present

## 2022-11-10 DIAGNOSIS — Z79899 Other long term (current) drug therapy: Secondary | ICD-10-CM | POA: Insufficient documentation

## 2022-11-10 DIAGNOSIS — R7989 Other specified abnormal findings of blood chemistry: Secondary | ICD-10-CM

## 2022-11-10 LAB — COMPREHENSIVE METABOLIC PANEL
ALT: 14 U/L (ref 0–44)
AST: 20 U/L (ref 15–41)
Albumin: 4 g/dL (ref 3.5–5.0)
Alkaline Phosphatase: 54 U/L (ref 38–126)
Anion gap: 9 (ref 5–15)
BUN: 8 mg/dL (ref 8–23)
CO2: 23 mmol/L (ref 22–32)
Calcium: 9 mg/dL (ref 8.9–10.3)
Chloride: 99 mmol/L (ref 98–111)
Creatinine, Ser: 0.77 mg/dL (ref 0.61–1.24)
GFR, Estimated: >60 mL/min (ref >60)
Glucose, Bld: 170 mg/dL — ABNORMAL HIGH (ref 70–99)
Potassium: 3.8 mmol/L (ref 3.5–5.1)
Sodium: 131 mmol/L — ABNORMAL LOW (ref 135–145)
Total Bilirubin: 1 mg/dL (ref 0.3–1.2)
Total Protein: 7.1 g/dL (ref 6.5–8.1)

## 2022-11-10 LAB — CBC WITH DIFFERENTIAL/PLATELET
Abs Immature Granulocytes: 0.01 10*3/uL (ref 0.00–0.07)
Basophils Absolute: 0 10*3/uL (ref 0.0–0.1)
Basophils Relative: 1 %
Eosinophils Absolute: 0.2 10*3/uL (ref 0.0–0.5)
Eosinophils Relative: 4 %
HCT: 39.7 % (ref 39.0–52.0)
Hemoglobin: 14.1 g/dL (ref 13.0–17.0)
Immature Granulocytes: 0 %
Lymphocytes Relative: 22 %
Lymphs Abs: 0.8 10*3/uL (ref 0.7–4.0)
MCH: 35.9 pg — ABNORMAL HIGH (ref 26.0–34.0)
MCHC: 35.5 g/dL (ref 30.0–36.0)
MCV: 101 fL — ABNORMAL HIGH (ref 80.0–100.0)
Monocytes Absolute: 0.3 10*3/uL (ref 0.1–1.0)
Monocytes Relative: 8 %
Neutro Abs: 2.3 10*3/uL (ref 1.7–7.7)
Neutrophils Relative %: 65 %
Platelets: 152 10*3/uL (ref 150–400)
RBC: 3.93 MIL/uL — ABNORMAL LOW (ref 4.22–5.81)
RDW: 12.9 % (ref 11.5–15.5)
WBC: 3.5 10*3/uL — ABNORMAL LOW (ref 4.0–10.5)
nRBC: 0 % (ref 0.0–0.2)

## 2022-11-10 LAB — FERRITIN: Ferritin: 149 ng/mL (ref 24–336)

## 2022-11-10 LAB — VITAMIN B12: Vitamin B-12: 273 pg/mL (ref 180–914)

## 2022-11-10 NOTE — Progress Notes (Signed)
Hematology/Oncology Consult note [facility]  [phone] [phone]  Patient Care Team: Marguarite Arbour, MD as PCP - General (Internal Medicine) Creig Hines, MD as Consulting Physician (Hematology and Oncology)   Name of the patient: Shawn Gonzalez  [id number]  1955-12-13   Date of visit: 11/10/22  Diagnosis-porphyria cutanea tarda  Chief complaint/ Reason for visit-routine follow-up of porphyria cutanea tarda for possible phlebotomy  Heme/Onc history: Patient is a 67 year old male with a past medical history significant for hypertension, anxiety, chronic back pain and tobacco dependence.  He has had on and off blistering of his bilateral hands and has seen dermatology in the past and was attributed to hematological disorder.  Patient states that he has came to the cancer center many years ago and underwent phlebotomy at that time.  He is unsure if he has hemochromatosis.  Family history of any liver disease.  He does report drinking alcohol sometimes he may have a few beers and 1 go sometimes he may go for days without drink.  He also smokes 1 pack of cigarettes per day for the last 42 years.   Results of blood work from 12/10/2018 showed elevated ferritin of 772.  HIV and hepatitis B and C testing was negative.  HFE gene testing was negative.     Ref Range & Units      4wk ago            Uroporphyrins (UP)     0 - 20 ug/L       380High   Heptacarboxyl (7-CP) 0 - 2 ug/L         395High   Hexacarboxyl (6-CP)  0 - 1 ug/L         <1  Pentacarboxyl (5-CP) 0 - 2 ug/L         38High   Coproporphyrin (CP) I 0 - 15 ug/L       64High   Coproporphyrin (CP) III          0 - 49 ug/L       58High       Overall results consistent with porphyria cutanea tarda and patient was started on phlebotomy to keep initial ferritin less than 25 followed by maintenance phlebotomy if ferritin is greater than 100      Interval history-patient is doing  well presently and denies any new complaints at this time.  He drinks a couple of alcoholic drinks daily.  He has not had any recent skin rash.  He has not been doing his B12 injections for the last few months.  ECOG PS- 1 Pain scale- 0   Review of systems- Review of Systems  Constitutional:  Negative for chills, fever, malaise/fatigue and weight loss.  HENT:  Negative for congestion, ear discharge and nosebleeds.   Eyes:  Negative for blurred vision.  Respiratory:  Negative for cough, hemoptysis, sputum production, shortness of breath and wheezing.   Cardiovascular:  Negative for chest pain, palpitations, orthopnea and claudication.  Gastrointestinal:  Negative for abdominal pain, blood in stool, constipation, diarrhea, heartburn, melena, nausea and vomiting.  Genitourinary:  Negative for dysuria, flank pain, frequency, hematuria and urgency.  Musculoskeletal:  Negative for back pain, joint pain and myalgias.  Skin:  Negative for rash.  Neurological:  Negative for dizziness, tingling, focal weakness, seizures, weakness and headaches.  Endo/Heme/Allergies:  Does not bruise/bleed easily.  Psychiatric/Behavioral:  Negative for depression and suicidal ideas. The patient does not have insomnia.  Allergies  Allergen Reactions   Celebrex [Celecoxib] Nausea Only     Past Medical History:  Diagnosis Date   Anxiety    Chronic low back pain    Depression    Elevated ferritin    Erectile dysfunction    History of chicken pox    Hypertension    Insomnia    Porphyria cutanea tarda (HCC)      Past Surgical History:  Procedure Laterality Date   COLONOSCOPY WITH PROPOFOL N/A 10/31/2016   Procedure: COLONOSCOPY WITH PROPOFOL;  Surgeon: Scot Jun, MD;  Location: [facility] ENDOSCOPY;  Service: Endoscopy;  Laterality: N/A;   FINGER SURGERY Right    RT.INDEX   HERNIA REPAIR     MULTIPLE TOOTH EXTRACTIONS     XI ROBOTIC ASSISTED INGUINAL HERNIA REPAIR WITH MESH Left 09/27/2020    Procedure: XI ROBOTIC ASSISTED INGUINAL HERNIA REPAIR WITH MESH;  Surgeon: Sung Amabile, DO;  Location: [facility] ORS;  Service: General;  Laterality: Left;    Social History   Socioeconomic History   Marital status: Married    Spouse name: Not on file   Number of children: Not on file   Years of education: Not on file   Highest education level: Not on file  Occupational History   Not on file  Tobacco Use   Smoking status: Every Day    Packs/day: 1.00    Years: 42.00    Additional pack years: 0.00    Total pack years: 42.00    Types: Cigarettes   Smokeless tobacco: Never  Vaping Use   Vaping Use: Never used  Substance and Sexual Activity   Alcohol use: Yes    Alcohol/week: 18.0 standard drinks of alcohol    Types: 18 Cans of beer per week   Drug use: No   Sexual activity: Not on file  Other Topics Concern   Not on file  Social History Narrative   Not on file   Social Determinants of Health   Financial Resource Strain: Not on file  Food Insecurity: Not on file  Transportation Needs: Not on file  Physical Activity: Not on file  Stress: Not on file  Social Connections: Not on file  Intimate Partner Violence: Not on file    History reviewed. No pertinent family history.   Current Outpatient Medications:    amLODipine (NORVASC) 10 MG tablet, Take 1 tablet by mouth daily., Disp: , Rfl:    bisoprolol-hydrochlorothiazide (ZIAC) 5-6.25 MG tablet, Take 1 tablet by mouth daily., Disp: , Rfl:    cyanocobalamin (VITAMIN B12) 1000 MCG/ML injection, Inject 1 mL (1,000 mcg total) into the muscle every 30 (thirty) days., Disp: 12 mL, Rfl: 0   cyclobenzaprine (FLEXERIL) 10 MG tablet, Take 1 tablet by mouth 3 (three) times daily as needed., Disp: , Rfl:    fluorouracil (EFUDEX) 5 % cream, Apply topically See admin instructions. Please use as directed. Refer to patient handout, Disp: 40 g, Rfl: 0   HYDROcodone-acetaminophen (NORCO) 5-325 MG tablet, Take 1 tablet by mouth every 6 (six)  hours as needed for up to 6 doses for moderate pain., Disp: 6 tablet, Rfl: 0   lisinopril (PRINIVIL,ZESTRIL) 40 MG tablet, Take 40 mg by mouth daily., Disp: , Rfl:    Syringe/Needle, Disp, (SYRINGE 3CC/25GX1") 25G X 1" 3 ML MISC, 3 mLs by Does not apply route every 30 (thirty) days., Disp: 12 each, Rfl: 0  Physical exam:  Vitals:   11/10/22 1102  BP: 122/69  Pulse: 64  Resp: 18  Temp: 99.1 F (37.3 C)  TempSrc: Tympanic  SpO2: 100%  Weight: 156 lb 11.2 oz (71.1 kg)  Height: 5\' 8"  (1.727 m)   Physical Exam Cardiovascular:     Rate and Rhythm: Normal rate and regular rhythm.     Heart sounds: Normal heart sounds.  Pulmonary:     Effort: Pulmonary effort is normal.     Breath sounds: Normal breath sounds.  Abdominal:     General: Bowel sounds are normal.     Palpations: Abdomen is soft.  Skin:    General: Skin is warm and dry.  Neurological:     Mental Status: He is alert and oriented to person, place, and time.         Latest Ref Rng & Units 11/10/2022   10:28 AM  CMP  Glucose 70 - 99 mg/dL 161   BUN 8 - 23 mg/dL 8   Creatinine 0.96 - 0.45 mg/dL 4.09   Sodium 811 - 914 mmol/L 131   Potassium 3.5 - 5.1 mmol/L 3.8   Chloride 98 - 111 mmol/L 99   CO2 22 - 32 mmol/L 23   Calcium 8.9 - 10.3 mg/dL 9.0   Total Protein 6.5 - 8.1 g/dL 7.1   Total Bilirubin 0.3 - 1.2 mg/dL 1.0   Alkaline Phos 38 - 126 U/L 54   AST 15 - 41 U/L 20   ALT 0 - 44 U/L 14       Latest Ref Rng & Units 11/10/2022   10:28 AM  CBC  WBC 4.0 - 10.5 K/uL 3.5   Hemoglobin 13.0 - 17.0 g/dL 78.2   Hematocrit 95.6 - 52.0 % 39.7   Platelets 150 - 400 K/uL 152      Assessment and plan- Patient is a 66 y.o. male with history of porphyria cutanea tarda here for possible phlebotomy  Hemoglobin and liver functions are normal.  Ferritin presently is more than 100 and therefore he will proceed with weekly phlebotomy at this time.  Ferritin is to keep it less than 100.  Repeat ferritin levels again in 2 weeks  in 4 weeks.  He will also need CBC along with it.  Otherwise I will repeat his labs in 6 months in 1 year and see him back in 1 year  History of B12 deficiency colonoscopy take oral B12 1000 mcg daily and we will repeat labs again in 1 year   Visit Diagnosis 1. Macrocytic anemia   2. B12 deficiency   3. Elevated ferritin level   4. Thrombocytopenia (HCC)      Dr. Owens Shark, MD, MPH [facility] [phone] 11/10/2022 3:38 PM

## 2022-11-13 ENCOUNTER — Other Ambulatory Visit: Payer: Self-pay | Admitting: *Deleted

## 2022-11-13 ENCOUNTER — Telehealth: Payer: Self-pay | Admitting: Oncology

## 2022-11-13 ENCOUNTER — Telehealth: Payer: Self-pay

## 2022-11-13 DIAGNOSIS — R7989 Other specified abnormal findings of blood chemistry: Secondary | ICD-10-CM

## 2022-11-13 DIAGNOSIS — D696 Thrombocytopenia, unspecified: Secondary | ICD-10-CM

## 2022-11-13 NOTE — Telephone Encounter (Signed)
Spoke with Emmit Alexanders  and she is going to get th patient to come in for labs and phlebotomy.

## 2022-11-13 NOTE — Telephone Encounter (Signed)
I have called the patient today several times to let him know that yes he is to return for his weekly phlebotomy and I can not leave a vm I will continue to reach the patient

## 2022-11-13 NOTE — Telephone Encounter (Signed)
Spoke to patient about dates and times of weekly labs and phlebotomies, he was okay with the dates that they were scheduled.

## 2022-11-17 ENCOUNTER — Inpatient Hospital Stay: Payer: Medicare Other

## 2022-11-17 VITALS — BP 122/74 | HR 64 | Temp 98.4°F | Resp 20

## 2022-11-17 DIAGNOSIS — R7989 Other specified abnormal findings of blood chemistry: Secondary | ICD-10-CM

## 2022-11-17 NOTE — Patient Instructions (Signed)

## 2022-11-18 ENCOUNTER — Encounter: Payer: Self-pay | Admitting: *Deleted

## 2022-11-24 ENCOUNTER — Inpatient Hospital Stay: Payer: Medicare Other

## 2022-11-25 ENCOUNTER — Ambulatory Visit
Admission: RE | Admit: 2022-11-25 | Discharge: 2022-11-25 | Disposition: A | Discharge status: Home / Self Care | Payer: Medicare Other | Attending: Gastroenterology | Admitting: Gastroenterology

## 2022-11-25 ENCOUNTER — Ambulatory Visit: Payer: Medicare Other | Admitting: Anesthesiology

## 2022-11-25 ENCOUNTER — Encounter: Payer: Self-pay | Admitting: Gastroenterology

## 2022-11-25 ENCOUNTER — Encounter
Admission: RE | Disposition: A | Discharge status: Home / Self Care | Payer: Self-pay | Source: Home / Self Care | Attending: Gastroenterology

## 2022-11-25 DIAGNOSIS — K5730 Diverticulosis of large intestine without perforation or abscess without bleeding: Secondary | ICD-10-CM | POA: Diagnosis not present

## 2022-11-25 DIAGNOSIS — Z1211 Encounter for screening for malignant neoplasm of colon: Secondary | ICD-10-CM | POA: Insufficient documentation

## 2022-11-25 DIAGNOSIS — I10 Essential (primary) hypertension: Secondary | ICD-10-CM | POA: Insufficient documentation

## 2022-11-25 DIAGNOSIS — Z86010 Personal history of colonic polyps: Secondary | ICD-10-CM | POA: Diagnosis not present

## 2022-11-25 DIAGNOSIS — F17200 Nicotine dependence, unspecified, uncomplicated: Secondary | ICD-10-CM | POA: Insufficient documentation

## 2022-11-25 DIAGNOSIS — Z79899 Other long term (current) drug therapy: Secondary | ICD-10-CM | POA: Insufficient documentation

## 2022-11-25 DIAGNOSIS — M545 Low back pain, unspecified: Secondary | ICD-10-CM | POA: Diagnosis not present

## 2022-11-25 DIAGNOSIS — G8929 Other chronic pain: Secondary | ICD-10-CM | POA: Diagnosis not present

## 2022-11-25 HISTORY — DX: Hemochromatosis, unspecified: E83.119

## 2022-11-25 HISTORY — PX: COLONOSCOPY WITH PROPOFOL: SHX5780

## 2022-11-25 SURGERY — COLONOSCOPY WITH PROPOFOL
Anesthesia: General

## 2022-11-25 MED ORDER — LIDOCAINE HCL (PF) 2 % IJ SOLN
INTRAMUSCULAR | Status: AC
  Filled 2022-11-25: qty 5

## 2022-11-25 MED ORDER — DEXMEDETOMIDINE HCL IN NACL 80 MCG/20ML IV SOLN
INTRAVENOUS | Status: DC | PRN
  Administered 2022-11-25: 4 ug via INTRAVENOUS
  Administered 2022-11-25: 16 ug via INTRAVENOUS

## 2022-11-25 MED ORDER — PROPOFOL 1000 MG/100ML IV EMUL
INTRAVENOUS | Status: AC
  Filled 2022-11-25: qty 100

## 2022-11-25 MED ORDER — DEXMEDETOMIDINE HCL IN NACL 80 MCG/20ML IV SOLN
INTRAVENOUS | Status: AC
  Filled 2022-11-25: qty 20

## 2022-11-25 MED ORDER — SODIUM CHLORIDE 0.9 % IV SOLN: INTRAVENOUS | Status: DC

## 2022-11-25 MED ORDER — PROPOFOL 500 MG/50ML IV EMUL
INTRAVENOUS | Status: DC | PRN
  Administered 2022-11-25: 150 ug/kg/min via INTRAVENOUS

## 2022-11-25 MED ORDER — PROPOFOL 10 MG/ML IV BOLUS
INTRAVENOUS | Status: DC | PRN
  Administered 2022-11-25: 120 mg via INTRAVENOUS
  Administered 2022-11-25: 80 mg via INTRAVENOUS

## 2022-11-25 MED ORDER — LIDOCAINE HCL (CARDIAC) PF 100 MG/5ML IV SOSY
PREFILLED_SYRINGE | INTRAVENOUS | Status: DC | PRN
  Administered 2022-11-25: 50 mg via INTRAVENOUS

## 2022-11-25 NOTE — H&P (Signed)
Outpatient short stay form Pre-procedure 11/25/2022  Regis Bill, MD  Primary Physician: Marguarite Arbour, MD  Reason for visit:  Surveillance  History of present illness:    67 y/o gentleman with history of hypertension and back pain on narcotics here for surveillance colonoscopy. Last colonoscopy in 2018 with small polyp. No blood thinners. No family history of GI malignancies. History of inguinal hernia repair.    Current Facility-Administered Medications:    0.9 %  sodium chloride infusion, , Intravenous, Continuous, Candida Vetter, Rossie Muskrat, MD, Last Rate: 20 mL/hr at 11/25/22 0730, New Bag at 11/25/22 0730  Medications Prior to Admission  Medication Sig Dispense Refill Last Dose   amLODipine (NORVASC) 10 MG tablet Take 1 tablet by mouth daily.   11/25/2022   bisoprolol-hydrochlorothiazide (ZIAC) 5-6.25 MG tablet Take 1 tablet by mouth daily.   11/25/2022   lisinopril (PRINIVIL,ZESTRIL) 40 MG tablet Take 40 mg by mouth daily.   11/25/2022   Oxycodone HCl 10 MG TABS Take 10 mg by mouth every 4 (four) hours as needed for pain.   11/24/2022   cyanocobalamin (VITAMIN B12) 1000 MCG/ML injection Inject 1 mL (1,000 mcg total) into the muscle every 30 (thirty) days. 12 mL 0    cyclobenzaprine (FLEXERIL) 10 MG tablet Take 1 tablet by mouth 3 (three) times daily as needed.      fluorouracil (EFUDEX) 5 % cream Apply topically See admin instructions. Please use as directed. Refer to patient handout 40 g 0    HYDROcodone-acetaminophen (NORCO) 5-325 MG tablet Take 1 tablet by mouth every 6 (six) hours as needed for up to 6 doses for moderate pain. 6 tablet 0    Syringe/Needle, Disp, (SYRINGE 3CC/25GX1") 25G X 1" 3 ML MISC 3 mLs by Does not apply route every 30 (thirty) days. 12 each 0      Allergies  Allergen Reactions   Celebrex [Celecoxib] Nausea Only     Past Medical History:  Diagnosis Date   Anxiety    Chronic low back pain    Depression    Elevated ferritin    Erectile dysfunction     Hemochromatosis    History of chicken pox    Hypertension    Insomnia    Porphyria cutanea tarda (HCC)     Review of systems:  Otherwise negative.    Physical Exam  Gen: Alert, oriented. Appears stated age.  HEENT: PERRLA. Lungs: No respiratory distress CV: RRR Abd: soft, benign, no masses Ext: No edema    Planned procedures: Proceed with colonoscopy. The patient understands the nature of the planned procedure, indications, risks, alternatives and potential complications including but not limited to bleeding, infection, perforation, damage to internal organs and possible oversedation/side effects from anesthesia. The patient agrees and gives consent to proceed.  Please refer to procedure notes for findings, recommendations and patient disposition/instructions.     Regis Bill, MD [facility]

## 2022-11-25 NOTE — Interval H&P Note (Signed)
History and Physical Interval Note:  11/25/2022 7:33 AM  Shawn Gonzalez  has presented today for surgery, with the diagnosis of V76.51 (ICD-9-CM) - Z12.11 (ICD-10-CM) - Colon cancer screening.  The various methods of treatment have been discussed with the patient and family. After consideration of risks, benefits and other options for treatment, the patient has consented to  Procedure(s): COLONOSCOPY WITH PROPOFOL (N/A) as a surgical intervention.  The patient's history has been reviewed, patient examined, no change in status, stable for surgery.  I have reviewed the patient's chart and labs.  Questions were answered to the patient's satisfaction.     Regis Bill  Ok to proceed with colonoscopy

## 2022-11-25 NOTE — Anesthesia Postprocedure Evaluation (Signed)
Anesthesia Post Note  Patient: Shawn Gonzalez  Procedure(s) Performed: COLONOSCOPY WITH PROPOFOL  Patient location during evaluation: Endoscopy Anesthesia Type: General Level of consciousness: awake and alert Pain management: pain level controlled Vital Signs Assessment: post-procedure vital signs reviewed and stable Respiratory status: spontaneous breathing, nonlabored ventilation and respiratory function stable Cardiovascular status: blood pressure returned to baseline and stable Postop Assessment: no apparent nausea or vomiting Anesthetic complications: no   No notable events documented.   Last Vitals:  Vitals:   11/25/22 0712 11/25/22 0800  BP: 137/82 (!) 87/56  Pulse: (!) 57   Resp: 20 (!) 21  Temp: (!) 36.2 C (!) 36.1 C  SpO2: 100%     Last Pain:  Vitals:   11/25/22 0830  TempSrc:   PainSc: 0-No pain                 Foye Deer

## 2022-11-25 NOTE — Transfer of Care (Signed)
Immediate Anesthesia Transfer of Care Note  Patient: Shawn Gonzalez  Procedure(s) Performed: COLONOSCOPY WITH PROPOFOL  Patient Location: PACU  Anesthesia Type:General  Level of Consciousness: sedated and unresponsive  Airway & Oxygen Therapy: Patient Spontanous Breathing  Post-op Assessment: Report given to RN and Post -op Vital signs reviewed and stable  Post vital signs: Reviewed and stable  Last Vitals:  Vitals Value Taken Time  BP 87/56 11/25/22 0800  Temp 36.1 C 11/25/22 0800  Pulse 56 11/25/22 0801  Resp 21 11/25/22 0801  SpO2 100 % 11/25/22 0801  Vitals shown include unvalidated device data.  Last Pain:  Vitals:   11/25/22 0800  TempSrc: Temporal  PainSc: Asleep         Complications: No notable events documented.

## 2022-11-25 NOTE — Op Note (Signed)
[facility] Gastroenterology Patient Name: Shawn Gonzalez Procedure Date: 11/25/2022 6:51 AM MRN: [id number] Account #: [id number] Date of Birth: Nov 04, 1955 Admit Type: Outpatient Age: 66 Room: [facility] ENDO ROOM 1 Gender: Male Note Status: Finalized Instrument Name: Prentice Docker [id number] Procedure:             Colonoscopy Indications:           Surveillance: Personal history of adenomatous polyps                         on last colonoscopy > 5 years ago Providers:             Eather Colas MD, MD Referring MD:          Duane Lope. Judithann Sheen, MD (Referring MD) Medicines:             Monitored Anesthesia Care Complications:         No immediate complications. Procedure:             Pre-Anesthesia Assessment:                        - Prior to the procedure, a History and Physical was                         performed, and patient medications and allergies were                         reviewed. The patient is competent. The risks and                         benefits of the procedure and the sedation options and                         risks were discussed with the patient. All questions                         were answered and informed consent was obtained.                         Patient identification and proposed procedure were                         verified by the physician, the nurse, the                         anesthesiologist, the anesthetist and the technician                         in the endoscopy suite. Mental Status Examination:                         alert and oriented. Airway Examination: normal                         oropharyngeal airway and neck mobility. Respiratory                         Examination: clear to auscultation. CV Examination:  normal. Prophylactic Antibiotics: The patient does not                         require prophylactic antibiotics. Prior                         Anticoagulants: The patient has taken no  anticoagulant                         or antiplatelet agents. ASA Grade Assessment: II - A                         patient with mild systemic disease. After reviewing                         the risks and benefits, the patient was deemed in                         satisfactory condition to undergo the procedure. The                         anesthesia plan was to use monitored anesthesia care                         (MAC). Immediately prior to administration of                         medications, the patient was re-assessed for adequacy                         to receive sedatives. The heart rate, respiratory                         rate, oxygen saturations, blood pressure, adequacy of                         pulmonary ventilation, and response to care were                         monitored throughout the procedure. The physical                         status of the patient was re-assessed after the                         procedure.                        After obtaining informed consent, the colonoscope was                         passed under direct vision. Throughout the procedure,                         the patient's blood pressure, pulse, and oxygen                         saturations were monitored continuously. The  Colonoscope was introduced through the anus and                         advanced to the the ileocecal valve. The colonoscopy                         was somewhat difficult due to inadequate bowel prep.                         The patient tolerated the procedure well. The quality                         of the bowel preparation was poor. The ileocecal valve                         and the rectum were photographed. Findings:      The perianal and digital rectal examinations were normal.      A single small-mouthed diverticulum was found in the sigmoid colon. Impression:            - Preparation of the colon was poor.                        -  Diverticulosis in the sigmoid colon.                        - No specimens collected. Recommendation:        - Discharge patient to home.                        - Resume previous diet.                        - Continue present medications.                        - Repeat colonoscopy at the next available appointment                         because the bowel preparation was suboptimal.                        - Return to referring physician as previously                         scheduled. Procedure Code(s):     --- Professional ---                        Z6109, Colorectal cancer screening; colonoscopy on                         individual at high risk Diagnosis Code(s):     --- Professional ---                        Z86.010, Personal history of colonic polyps                        K57.30, Diverticulosis of large intestine without  perforation or abscess without bleeding CPT copyright 2022 American Medical Association. All rights reserved. The codes documented in this report are preliminary and upon coder review may  be revised to meet current compliance requirements. Eather Colas MD, MD 11/25/2022 8:06:10 AM Number of Addenda: 0 Note Initiated On: 11/25/2022 6:51 AM Scope Withdrawal Time: 0 hours 1 minute 41 seconds  Total Procedure Duration: 0 hours 10 minutes 51 seconds  Estimated Blood Loss:  Estimated blood loss: none.      [facility]

## 2022-11-25 NOTE — Anesthesia Preprocedure Evaluation (Signed)
Anesthesia Evaluation  Patient identified by MRN, date of birth, ID band Patient awake    Reviewed: Allergy & Precautions, H&P , NPO status , Patient's Chart, lab work & pertinent test results, reviewed documented beta blocker date and time   Airway Mallampati: II  TM Distance: >3 FB Neck ROM: full    Dental no notable dental hx.    Pulmonary neg pulmonary ROS, Current Smoker   Pulmonary exam normal        Cardiovascular hypertension, Pt. on home beta blockers and Pt. on medications Normal cardiovascular exam     Neuro/Psych  PSYCHIATRIC DISORDERS  Depression    negative neurological ROS     GI/Hepatic negative GI ROS,,,Hemochromatosis   Endo/Other  negative endocrine ROS    Renal/GU negative Renal ROS  negative genitourinary   Musculoskeletal   Abdominal   Peds  Hematology   Anesthesia Other Findings Past Medical History: No date: Anxiety No date: Chronic low back pain No date: Depression No date: Elevated ferritin No date: Erectile dysfunction No date: Hemochromatosis No date: History of chicken pox No date: Hypertension No date: Insomnia No date: Porphyria cutanea tarda [facility])  Past Surgical History: 10/31/2016: COLONOSCOPY WITH PROPOFOL; N/A     Comment:  Procedure: COLONOSCOPY WITH PROPOFOL;  Surgeon: Scot Jun, MD;  Location: [facility] ENDOSCOPY;  Service:               Endoscopy;  Laterality: N/A; No date: FINGER SURGERY; Right     Comment:  RT.INDEX No date: HERNIA REPAIR No date: MULTIPLE TOOTH EXTRACTIONS 09/27/2020: XI ROBOTIC ASSISTED INGUINAL HERNIA REPAIR WITH MESH; Left     Comment:  Procedure: XI ROBOTIC ASSISTED INGUINAL HERNIA REPAIR               WITH MESH;  Surgeon: Sung Amabile, DO;  Location: [facility]               ORS;  Service: General;  Laterality: Left;     Reproductive/Obstetrics negative OB ROS                              Anesthesia  Physical Anesthesia Plan  ASA: 2  Anesthesia Plan: General   Post-op Pain Management:    Induction: Intravenous  PONV Risk Score and Plan: Propofol infusion and TIVA  Airway Management Planned: Natural Airway  Additional Equipment:   Intra-op Plan:   Post-operative Plan:   Informed Consent:      Dental Advisory Given  Plan Discussed with: CRNA and Surgeon  Anesthesia Plan Comments:          Anesthesia Quick Evaluation

## 2022-12-01 ENCOUNTER — Inpatient Hospital Stay: Payer: Medicare Other

## 2022-12-01 ENCOUNTER — Inpatient Hospital Stay: Payer: Medicare Other | Attending: Oncology

## 2022-12-01 VITALS — BP 136/75 | HR 56 | Resp 20

## 2022-12-01 DIAGNOSIS — R7989 Other specified abnormal findings of blood chemistry: Secondary | ICD-10-CM

## 2022-12-01 DIAGNOSIS — D696 Thrombocytopenia, unspecified: Secondary | ICD-10-CM

## 2022-12-01 LAB — HEMOGLOBIN AND HEMATOCRIT (CANCER CENTER ONLY)
HCT: 40.6 % (ref 39.0–52.0)
Hemoglobin: 14.1 g/dL (ref 13.0–17.0)

## 2022-12-01 NOTE — Patient Instructions (Signed)

## 2022-12-08 ENCOUNTER — Inpatient Hospital Stay: Payer: Medicare Other

## 2022-12-08 VITALS — BP 125/70 | HR 60

## 2022-12-08 DIAGNOSIS — R7989 Other specified abnormal findings of blood chemistry: Secondary | ICD-10-CM

## 2022-12-08 DIAGNOSIS — D696 Thrombocytopenia, unspecified: Secondary | ICD-10-CM

## 2022-12-08 LAB — HEMOGLOBIN AND HEMATOCRIT (CANCER CENTER ONLY)
HCT: 37.5 % — ABNORMAL LOW (ref 39.0–52.0)
Hemoglobin: 13.3 g/dL (ref 13.0–17.0)

## 2022-12-08 LAB — FERRITIN: Ferritin: 73 ng/mL (ref 24–336)

## 2022-12-15 ENCOUNTER — Inpatient Hospital Stay: Payer: Medicare Other

## 2022-12-22 ENCOUNTER — Inpatient Hospital Stay: Payer: Medicare Other

## 2023-05-12 ENCOUNTER — Inpatient Hospital Stay: Payer: Medicare Other | Attending: Oncology

## 2023-05-12 DIAGNOSIS — R7989 Other specified abnormal findings of blood chemistry: Secondary | ICD-10-CM

## 2023-05-12 DIAGNOSIS — D696 Thrombocytopenia, unspecified: Secondary | ICD-10-CM

## 2023-05-12 DIAGNOSIS — E538 Deficiency of other specified B group vitamins: Secondary | ICD-10-CM | POA: Insufficient documentation

## 2023-05-12 DIAGNOSIS — D649 Anemia, unspecified: Secondary | ICD-10-CM | POA: Insufficient documentation

## 2023-05-12 LAB — CMP (CANCER CENTER ONLY)
ALT: 12 U/L (ref 0–44)
AST: 18 U/L (ref 15–41)
Albumin: 3.9 g/dL (ref 3.5–5.0)
Alkaline Phosphatase: 48 U/L (ref 38–126)
Anion gap: 8 (ref 5–15)
BUN: 10 mg/dL (ref 8–23)
CO2: 22 mmol/L (ref 22–32)
Calcium: 9 mg/dL (ref 8.9–10.3)
Chloride: 102 mmol/L (ref 98–111)
Creatinine: 1.06 mg/dL (ref 0.61–1.24)
GFR, Estimated: >60 mL/min (ref >60)
Glucose, Bld: 144 mg/dL — ABNORMAL HIGH (ref 70–99)
Potassium: 3.9 mmol/L (ref 3.5–5.1)
Sodium: 132 mmol/L — ABNORMAL LOW (ref 135–145)
Total Bilirubin: 0.5 mg/dL (ref <1.2)
Total Protein: 6.6 g/dL (ref 6.5–8.1)

## 2023-05-12 LAB — CBC WITH DIFFERENTIAL (CANCER CENTER ONLY)
Abs Immature Granulocytes: 0.02 10*3/uL (ref 0.00–0.07)
Basophils Absolute: 0 10*3/uL (ref 0.0–0.1)
Basophils Relative: 0 %
Eosinophils Absolute: 0.2 10*3/uL (ref 0.0–0.5)
Eosinophils Relative: 5 %
HCT: 41 % (ref 39.0–52.0)
Hemoglobin: 14.4 g/dL (ref 13.0–17.0)
Immature Granulocytes: 0 %
Lymphocytes Relative: 18 %
Lymphs Abs: 0.9 10*3/uL (ref 0.7–4.0)
MCH: 36.3 pg — ABNORMAL HIGH (ref 26.0–34.0)
MCHC: 35.1 g/dL (ref 30.0–36.0)
MCV: 103.3 fL — ABNORMAL HIGH (ref 80.0–100.0)
Monocytes Absolute: 0.4 10*3/uL (ref 0.1–1.0)
Monocytes Relative: 9 %
Neutro Abs: 3.1 10*3/uL (ref 1.7–7.7)
Neutrophils Relative %: 68 %
Platelet Count: 177 10*3/uL (ref 150–400)
RBC: 3.97 MIL/uL — ABNORMAL LOW (ref 4.22–5.81)
RDW: 12.9 % (ref 11.5–15.5)
WBC Count: 4.6 10*3/uL (ref 4.0–10.5)
nRBC: 0 % (ref 0.0–0.2)

## 2023-05-12 LAB — FERRITIN: Ferritin: 91 ng/mL (ref 24–336)

## 2023-10-11 ENCOUNTER — Encounter: Payer: Self-pay | Admitting: Oncology

## 2023-11-09 ENCOUNTER — Other Ambulatory Visit: Payer: Self-pay

## 2023-11-09 DIAGNOSIS — D696 Thrombocytopenia, unspecified: Secondary | ICD-10-CM

## 2023-11-09 DIAGNOSIS — D539 Nutritional anemia, unspecified: Secondary | ICD-10-CM

## 2023-11-09 DIAGNOSIS — E538 Deficiency of other specified B group vitamins: Secondary | ICD-10-CM

## 2023-11-09 DIAGNOSIS — R7989 Other specified abnormal findings of blood chemistry: Secondary | ICD-10-CM

## 2023-11-10 ENCOUNTER — Other Ambulatory Visit: Payer: Self-pay

## 2023-11-10 ENCOUNTER — Inpatient Hospital Stay (HOSPITAL_BASED_OUTPATIENT_CLINIC_OR_DEPARTMENT_OTHER): Payer: Medicare Other | Admitting: Oncology

## 2023-11-10 ENCOUNTER — Inpatient Hospital Stay: Payer: Medicare Other | Attending: Oncology

## 2023-11-10 ENCOUNTER — Encounter: Payer: Self-pay | Admitting: Oncology

## 2023-11-10 DIAGNOSIS — R7989 Other specified abnormal findings of blood chemistry: Secondary | ICD-10-CM

## 2023-11-10 DIAGNOSIS — D696 Thrombocytopenia, unspecified: Secondary | ICD-10-CM | POA: Insufficient documentation

## 2023-11-10 DIAGNOSIS — E538 Deficiency of other specified B group vitamins: Secondary | ICD-10-CM | POA: Insufficient documentation

## 2023-11-10 DIAGNOSIS — Z79899 Other long term (current) drug therapy: Secondary | ICD-10-CM | POA: Insufficient documentation

## 2023-11-10 DIAGNOSIS — D539 Nutritional anemia, unspecified: Secondary | ICD-10-CM

## 2023-11-10 LAB — CMP (CANCER CENTER ONLY)
ALT: 13 U/L (ref 0–44)
AST: 18 U/L (ref 15–41)
Albumin: 4.3 g/dL (ref 3.5–5.0)
Alkaline Phosphatase: 54 U/L (ref 38–126)
Anion gap: 10 (ref 5–15)
BUN: 11 mg/dL (ref 8–23)
CO2: 23 mmol/L (ref 22–32)
Calcium: 9.3 mg/dL (ref 8.9–10.3)
Chloride: 99 mmol/L (ref 98–111)
Creatinine: 0.88 mg/dL (ref 0.61–1.24)
GFR, Estimated: >60 mL/min (ref >60)
Glucose, Bld: 135 mg/dL — ABNORMAL HIGH (ref 70–99)
Potassium: 3.8 mmol/L (ref 3.5–5.1)
Sodium: 132 mmol/L — ABNORMAL LOW (ref 135–145)
Total Bilirubin: 1 mg/dL (ref 0.0–1.2)
Total Protein: 7.6 g/dL (ref 6.5–8.1)

## 2023-11-10 LAB — CBC WITH DIFFERENTIAL (CANCER CENTER ONLY)
Abs Immature Granulocytes: 0.02 10*3/uL (ref 0.00–0.07)
Basophils Absolute: 0 10*3/uL (ref 0.0–0.1)
Basophils Relative: 1 %
Eosinophils Absolute: 0.3 10*3/uL (ref 0.0–0.5)
Eosinophils Relative: 6 %
HCT: 43.7 % (ref 39.0–52.0)
Hemoglobin: 15.5 g/dL (ref 13.0–17.0)
Immature Granulocytes: 0 %
Lymphocytes Relative: 18 %
Lymphs Abs: 0.9 10*3/uL (ref 0.7–4.0)
MCH: 36 pg — ABNORMAL HIGH (ref 26.0–34.0)
MCHC: 35.5 g/dL (ref 30.0–36.0)
MCV: 101.4 fL — ABNORMAL HIGH (ref 80.0–100.0)
Monocytes Absolute: 0.4 10*3/uL (ref 0.1–1.0)
Monocytes Relative: 8 %
Neutro Abs: 3.1 10*3/uL (ref 1.7–7.7)
Neutrophils Relative %: 67 %
Platelet Count: 189 10*3/uL (ref 150–400)
RBC: 4.31 MIL/uL (ref 4.22–5.81)
RDW: 12.3 % (ref 11.5–15.5)
WBC Count: 4.7 10*3/uL (ref 4.0–10.5)
nRBC: 0 % (ref 0.0–0.2)

## 2023-11-10 LAB — VITAMIN B12: Vitamin B-12: 171 pg/mL — ABNORMAL LOW (ref 180–914)

## 2023-11-10 LAB — FERRITIN: Ferritin: 95 ng/mL (ref 24–336)

## 2023-11-10 NOTE — Progress Notes (Signed)
 Patient indicated he had an xray done recently (did not see in chart). Has no complaints at this time; denies pain.

## 2023-11-10 NOTE — Progress Notes (Signed)
 Hematology/Oncology Consult note [facility]  Telephone:([phone] [phone]  Patient Care Team: Yehuda Helms, MD as PCP - General (Internal Medicine) Avonne Boettcher, MD as Consulting Physician (Hematology and Oncology)   Name of the patient: Shawn Gonzalez  [id number]  21-Oct-1955   Date of visit: 11/10/23  Diagnosis-porphyria cutanea tarda  Chief complaint/ Reason for visit-routine follow-up of porphyria cutanea tarda for possible phlebotomy  Heme/Onc history: Patient is a 68 year old male with a past medical history significant for hypertension, anxiety, chronic back pain and tobacco dependence.  He has had on and off blistering of his bilateral hands and has seen dermatology in the past and was attributed to hematological disorder.  Patient states that he has came to the cancer center many years ago and underwent phlebotomy at that time.  He is unsure if he has hemochromatosis.  Family history of any liver disease.  He does report drinking alcohol sometimes he may have a few beers and 1 go sometimes he may go for days without drink.  He also smokes 1 pack of cigarettes per day for the last 42 years.   Results of blood work from 12/10/2018 showed elevated ferritin of 772.  HIV and hepatitis B and C testing was negative.  HFE gene testing was negative.     Ref Range & Units      4wk ago            Uroporphyrins (UP)     0 - 20 ug/L       380High   Heptacarboxyl (7-CP) 0 - 2 ug/L         395High   Hexacarboxyl (6-CP)  0 - 1 ug/L         <1  Pentacarboxyl (5-CP) 0 - 2 ug/L         38High   Coproporphyrin (CP) I 0 - 15 ug/L       64High   Coproporphyrin (CP) III          0 - 49 ug/L       58High       Overall results consistent with porphyria cutanea tarda and patient was started on phlebotomy to keep initial ferritin less than 25 followed by maintenance phlebotomy if ferritin is greater than 100  Interval history-patient is doing well  overall but continues to drink alcohol on a daily basis.  Denies any skin rash or blisters.  He has not been on B12 injections monthly for close to a year now.  ECOG PS- 1 Pain scale- 0   Review of systems- Review of Systems  Constitutional:  Negative for chills, fever, malaise/fatigue and weight loss.  HENT:  Negative for congestion, ear discharge and nosebleeds.   Eyes:  Negative for blurred vision.  Respiratory:  Negative for cough, hemoptysis, sputum production, shortness of breath and wheezing.   Cardiovascular:  Negative for chest pain, palpitations, orthopnea and claudication.  Gastrointestinal:  Negative for abdominal pain, blood in stool, constipation, diarrhea, heartburn, melena, nausea and vomiting.  Genitourinary:  Negative for dysuria, flank pain, frequency, hematuria and urgency.  Musculoskeletal:  Negative for back pain, joint pain and myalgias.  Skin:  Negative for rash.  Neurological:  Negative for dizziness, tingling, focal weakness, seizures, weakness and headaches.  Endo/Heme/Allergies:  Does not bruise/bleed easily.  Psychiatric/Behavioral:  Negative for depression and suicidal ideas. The patient does not have insomnia.       Allergies  Allergen Reactions   Celebrex [  Celecoxib] Nausea Only     Past Medical History:  Diagnosis Date   Anxiety    Chronic low back pain    Depression    Elevated ferritin    Erectile dysfunction    Hemochromatosis    History of chicken pox    Hypertension    Insomnia    Porphyria cutanea tarda (HCC)      Past Surgical History:  Procedure Laterality Date   COLONOSCOPY WITH PROPOFOL  N/A 10/31/2016   Procedure: COLONOSCOPY WITH PROPOFOL ;  Surgeon: Cassie Click, MD;  Location: [facility] ENDOSCOPY;  Service: Endoscopy;  Laterality: N/A;   COLONOSCOPY WITH PROPOFOL  N/A 11/25/2022   Procedure: COLONOSCOPY WITH PROPOFOL ;  Surgeon: Shane Darling, MD;  Location: [facility] ENDOSCOPY;  Service: Endoscopy;  Laterality: N/A;   FINGER  SURGERY Right    RT.INDEX   HERNIA REPAIR     MULTIPLE TOOTH EXTRACTIONS     XI ROBOTIC ASSISTED INGUINAL HERNIA REPAIR WITH MESH Left 09/27/2020   Procedure: XI ROBOTIC ASSISTED INGUINAL HERNIA REPAIR WITH MESH;  Surgeon: Conrado Delay, DO;  Location: [facility] ORS;  Service: General;  Laterality: Left;    Social History   Socioeconomic History   Marital status: Married    Spouse name: Not on file   Number of children: Not on file   Years of education: Not on file   Highest education level: Not on file  Occupational History   Not on file  Tobacco Use   Smoking status: Every Day    Current packs/day: 1.00    Average packs/day: 1 pack/day for 42.0 years (42.0 ttl pk-yrs)    Types: Cigarettes   Smokeless tobacco: Never  Vaping Use   Vaping status: Never Used  Substance and Sexual Activity   Alcohol use: Yes    Alcohol/week: 18.0 standard drinks of alcohol    Types: 18 Cans of beer per week    Comment: occ.   Drug use: No   Sexual activity: Not on file  Other Topics Concern   Not on file  Social History Narrative   Not on file   Social Drivers of Health   Financial Resource Strain: Low Risk  (06/10/2023)   Received from [facility]   Overall Financial Resource Strain (CARDIA)    Difficulty of Paying Living Expenses: Not hard at all  Food Insecurity: No Food Insecurity (06/10/2023)   Received from [facility]   Hunger Vital Sign    Within the past 12 months, you worried that your food would run out before you got the money to buy more.: Never true    Within the past 12 months, the food you bought just didn't last and you didn't have money to get more.: Never true  Transportation Needs: No Transportation Needs (06/10/2023)   Received from [facility] - Transportation    In the past 12 months, has lack of transportation kept you from medical appointments or from getting medications?: No    Lack of Transportation  (Non-Medical): No  Physical Activity: Not on file  Stress: Not on file  Social Connections: Not on file  Intimate Partner Violence: Not on file    No family history on file.   Current Outpatient Medications:    amLODipine (NORVASC) 10 MG tablet, Take 1 tablet by mouth daily., Disp: , Rfl:    bisoprolol-hydrochlorothiazide (ZIAC) 5-6.25 MG tablet, Take 1 tablet by mouth daily., Disp: , Rfl:    cyclobenzaprine (FLEXERIL) 10  MG tablet, Take 1 tablet by mouth 3 (three) times daily as needed., Disp: , Rfl:    lisinopril (PRINIVIL,ZESTRIL) 40 MG tablet, Take 40 mg by mouth daily., Disp: , Rfl:    Oxycodone HCl 10 MG TABS, Take 10 mg by mouth every 4 (four) hours as needed for pain., Disp: , Rfl:    cyanocobalamin  (VITAMIN B12) 1000 MCG/ML injection, Inject 1 mL (1,000 mcg total) into the muscle every 30 (thirty) days. (Patient not taking: Reported on 11/10/2023), Disp: 12 mL, Rfl: 0   fluorouracil  (EFUDEX ) 5 % cream, Apply topically See admin instructions. Please use as directed. Refer to patient handout, Disp: 40 g, Rfl: 0   HYDROcodone -acetaminophen  (NORCO) 5-325 MG tablet, Take 1 tablet by mouth every 6 (six) hours as needed for up to 6 doses for moderate pain. (Patient not taking: Reported on 11/10/2023), Disp: 6 tablet, Rfl: 0   Syringe/Needle, Disp, (SYRINGE 3CC/25GX1) 25G X 1 3 ML MISC, 3 mLs by Does not apply route every 30 (thirty) days. (Patient not taking: Reported on 11/10/2023), Disp: 12 each, Rfl: 0  Physical exam:  Vitals:   11/10/23 1125  BP: 125/72  Pulse: (!) 59  Resp: 16  Temp: 98.8 F (37.1 C)  TempSrc: Tympanic  SpO2: 100%  Weight: 155 lb 12.8 oz (70.7 kg)  Height: 5' 8 (1.727 m)   Physical Exam  Cardiovascular:     Rate and Rhythm: Normal rate and regular rhythm.     Heart sounds: Normal heart sounds.  Pulmonary:     Effort: Pulmonary effort is normal.     Breath sounds: Normal breath sounds.  Abdominal:     General: Bowel sounds are normal.   Skin:     General: Skin is warm and dry.   Neurological:     Mental Status: He is alert and oriented to person, place, and time.      I have personally reviewed labs listed below:    Latest Ref Rng & Units 11/10/2023   11:03 AM  CMP  Glucose 70 - 99 mg/dL 454   BUN 8 - 23 mg/dL 11   Creatinine 0.98 - 1.24 mg/dL 1.19   Sodium 147 - 829 mmol/L 132   Potassium 3.5 - 5.1 mmol/L 3.8   Chloride 98 - 111 mmol/L 99   CO2 22 - 32 mmol/L 23   Calcium 8.9 - 10.3 mg/dL 9.3   Total Protein 6.5 - 8.1 g/dL 7.6   Total Bilirubin 0.0 - 1.2 mg/dL 1.0   Alkaline Phos 38 - 126 U/L 54   AST 15 - 41 U/L 18   ALT 0 - 44 U/L 13       Latest Ref Rng & Units 11/10/2023   11:03 AM  CBC  WBC 4.0 - 10.5 K/uL 4.7   Hemoglobin 13.0 - 17.0 g/dL 56.2   Hematocrit 13.0 - 52.0 % 43.7   Platelets 150 - 400 K/uL 189     Assessment and plan- Patient is a 68 y.o. male here for routine follow-up of porphyria cutanea tarda  LFTs are presently normal and ferritin levels are less than 100.Patient does not require any phlebotomy at this time.  Goal is to keep his ferritin less than 100.  CMP remains within normal limits.  I have encouraged him to cut down on his alcohol intake.  History of B12 deficiency: He will take oral B12 1000 mcg p.o. daily.  B12 levels from today are pending.  Labs in 6 months in 1  year and I will see him back in 1 year   Visit Diagnosis 1. Porphyria cutanea tarda (HCC)   2. Thrombocytopenia (HCC)   3. Elevated ferritin level   4. B12 deficiency      Dr. Seretha Dance, MD, MPH [facility] [phone] 11/10/2023 4:33 PM

## 2023-11-11 ENCOUNTER — Ambulatory Visit: Payer: Self-pay | Admitting: Oncology

## 2023-11-11 ENCOUNTER — Telehealth: Payer: Self-pay | Admitting: *Deleted

## 2023-11-11 DIAGNOSIS — E538 Deficiency of other specified B group vitamins: Secondary | ICD-10-CM

## 2023-11-11 NOTE — Telephone Encounter (Addendum)
 Per Dr. Randy Buttery Ideally patient needs to get back to b12 injections monthly either take it at home or come to cancer center.  Outbound call; unable to leave voice message, voicemail box not set up.  My chart message sent with details.

## 2023-11-11 NOTE — Telephone Encounter (Signed)
-----   Message from Avonne Boettcher sent at 11/11/2023  8:26 AM EDT ----- Ideally patient needs to get back to b12 injections monthly either take it at home or come to cancer center ----- Message ----- From: Interface, Lab In [place] Sent: 11/10/2023  11:18 AM EDT To: Avonne Boettcher, MD

## 2023-11-11 NOTE — Telephone Encounter (Signed)
 Patient wanted to make sure if he is going to have to have phlebotomy aced on his ferritin, per message and Dr. Denese Finn chart that if you are lower than 100 you do not need the phlebotomy.  His number was 95 that he does not have to come and get a phlebotomy

## 2023-11-12 NOTE — Telephone Encounter (Addendum)
 Patient has not read my chart message as of yet; will reach out again shortly.

## 2023-11-13 NOTE — Telephone Encounter (Signed)
 Spoke to caregiver / spouse Shawn Gonzalez; patient was not present.  Spouse indicated they will move forward with whatever is best; she did mention that Shawn Gonzalez said even when he was previously receiving B12 injections he did not notice much of a difference.  Reviewed wrap up from last OV note stating He will take oral B12 1000 mcg p.o. daily; spouse inquired would patient still have to take oral b12 if he does agree to injections.  Informed I would touch base w/ Dr. Randy Buttery and follow up; spouse verbalized understanding.

## 2023-11-13 NOTE — Progress Notes (Signed)
 I think it would better if he can take injections given his levels are so low if soemone can give it to him. If not he can take po b12 and we will repeat levels with his next set of labs

## 2023-11-13 NOTE — Telephone Encounter (Signed)
 Per Dr. Randy Buttery I think it would better if he can take injections given his levels are so low if soemone can give it to him. If not he can take po b12 and we will repeat levels with his next set of lab.  Outbound call; spoke to caregiver / spouse who states she does not feel comfortable giving him injections and that another party had to conduct previous injections.  She will speak to him this weekend and have patient follow up with clinic next week.

## 2023-11-25 ENCOUNTER — Encounter: Payer: Self-pay | Admitting: Oncology

## 2023-11-25 NOTE — Telephone Encounter (Signed)
 Outbound call, spoke to patient.  Patient states he prefers to continue with oral b12 and have his levels checked next time.  Tagging scheduling; per last OV note dated 11/09/23 Labs in 6 months in 1 year and I will see him back in 1 year.  Lab orders are in.

## 2024-03-25 ENCOUNTER — Encounter: Payer: Self-pay | Admitting: Oncology

## 2024-05-09 ENCOUNTER — Ambulatory Visit: Payer: Self-pay | Admitting: Oncology

## 2024-05-09 ENCOUNTER — Inpatient Hospital Stay: Attending: Oncology

## 2024-05-09 DIAGNOSIS — D696 Thrombocytopenia, unspecified: Secondary | ICD-10-CM

## 2024-05-09 DIAGNOSIS — R7989 Other specified abnormal findings of blood chemistry: Secondary | ICD-10-CM

## 2024-05-09 DIAGNOSIS — E538 Deficiency of other specified B group vitamins: Secondary | ICD-10-CM

## 2024-05-09 LAB — CMP (CANCER CENTER ONLY)
ALT: 9 U/L (ref 0–44)
AST: 15 U/L (ref 15–41)
Albumin: 4.5 g/dL (ref 3.5–5.0)
Alkaline Phosphatase: 60 U/L (ref 38–126)
Anion gap: 10 (ref 5–15)
BUN: 10 mg/dL (ref 8–23)
CO2: 25 mmol/L (ref 22–32)
Calcium: 9.7 mg/dL (ref 8.9–10.3)
Chloride: 99 mmol/L (ref 98–111)
Creatinine: 0.92 mg/dL (ref 0.61–1.24)
GFR, Estimated: >60 mL/min (ref >60)
Glucose, Bld: 100 mg/dL — ABNORMAL HIGH (ref 70–99)
Potassium: 3.7 mmol/L (ref 3.5–5.1)
Sodium: 134 mmol/L — ABNORMAL LOW (ref 135–145)
Total Bilirubin: 0.7 mg/dL (ref 0.0–1.2)
Total Protein: 7.4 g/dL (ref 6.5–8.1)

## 2024-05-09 LAB — CBC (CANCER CENTER ONLY)
HCT: 43.1 % (ref 39.0–52.0)
Hemoglobin: 15.2 g/dL (ref 13.0–17.0)
MCH: 36 pg — ABNORMAL HIGH (ref 26.0–34.0)
MCHC: 35.3 g/dL (ref 30.0–36.0)
MCV: 102.1 fL — ABNORMAL HIGH (ref 80.0–100.0)
Platelet Count: 169 K/uL (ref 150–400)
RBC: 4.22 MIL/uL (ref 4.22–5.81)
RDW: 12.7 % (ref 11.5–15.5)
WBC Count: 4 K/uL (ref 4.0–10.5)
nRBC: 0 % (ref 0.0–0.2)

## 2024-05-09 LAB — VITAMIN B12: Vitamin B-12: 158 pg/mL — ABNORMAL LOW (ref 180–914)

## 2024-05-09 LAB — FERRITIN: Ferritin: 387 ng/mL — ABNORMAL HIGH (ref 24–336)

## 2024-05-11 ENCOUNTER — Encounter: Payer: Self-pay | Admitting: Oncology

## 2024-05-11 NOTE — Telephone Encounter (Signed)
-----   Message from Annah Skene, MD sent at 05/09/2024  1:39 PM EST ----- H&H and phlebotomy every week X3.  Repeat ferritin in 3 weeks

## 2024-05-11 NOTE — Telephone Encounter (Addendum)
 Per Dr. Melanee H&H and phlebotomy every week X3. Repeat ferritin in 3 weeks.  His B12 level will remains low. Does he want to come to cancer center for monthly injections? Is he taking B12 injections regularly at home on a monthly basis?.  Outbound call; unable to leave a voice message.  My chart message sent.

## 2024-05-11 NOTE — Telephone Encounter (Signed)
 Patient reviewed my chart message sent earlier: Last Read in MyChart 05/11/2024  1:11 PM by Shawn Gonzalez

## 2024-05-16 ENCOUNTER — Inpatient Hospital Stay

## 2024-05-16 VITALS — BP 113/68 | HR 63 | Temp 97.7°F | Resp 16

## 2024-05-16 DIAGNOSIS — R7989 Other specified abnormal findings of blood chemistry: Secondary | ICD-10-CM

## 2024-05-16 DIAGNOSIS — D696 Thrombocytopenia, unspecified: Secondary | ICD-10-CM

## 2024-05-16 LAB — HEMOGLOBIN AND HEMATOCRIT (CANCER CENTER ONLY)
HCT: 41 % (ref 39.0–52.0)
Hemoglobin: 14.4 g/dL (ref 13.0–17.0)

## 2024-05-16 MED ORDER — CYANOCOBALAMIN 1000 MCG/ML IJ SOLN: 1000.0000 ug | INTRAMUSCULAR | 0 refills | Status: AC

## 2024-05-16 MED ORDER — "SYRINGE 25G X 1"" 3 ML MISC": 3.0000 mL | 0 refills | Status: AC

## 2024-05-16 NOTE — Addendum Note (Signed)
 Addended by: Shahiem Bedwell on: 05/16/2024 10:47 AM   Modules accepted: Orders

## 2024-05-16 NOTE — Patient Instructions (Signed)

## 2024-05-16 NOTE — Progress Notes (Signed)
 Shawn Gonzalez Shawn Gonzalez presents today for phlebotomy per MD orders. Phlebotomy procedure started at 1015 and ended at 1027. 250 mls removed. Patient tolerated procedure well. IV needle removed intact.

## 2024-05-23 ENCOUNTER — Inpatient Hospital Stay

## 2024-05-23 VITALS — BP 108/71 | HR 58 | Temp 97.7°F | Resp 18

## 2024-05-23 DIAGNOSIS — R7989 Other specified abnormal findings of blood chemistry: Secondary | ICD-10-CM

## 2024-05-23 DIAGNOSIS — D696 Thrombocytopenia, unspecified: Secondary | ICD-10-CM

## 2024-05-23 LAB — HEMOGLOBIN AND HEMATOCRIT (CANCER CENTER ONLY)
HCT: 39.6 % (ref 39.0–52.0)
Hemoglobin: 14.2 g/dL (ref 13.0–17.0)

## 2024-05-27 ENCOUNTER — Other Ambulatory Visit: Payer: Self-pay

## 2024-05-27 DIAGNOSIS — R7989 Other specified abnormal findings of blood chemistry: Secondary | ICD-10-CM

## 2024-05-27 DIAGNOSIS — D696 Thrombocytopenia, unspecified: Secondary | ICD-10-CM

## 2024-05-30 ENCOUNTER — Ambulatory Visit: Payer: Self-pay | Admitting: Oncology

## 2024-05-30 ENCOUNTER — Inpatient Hospital Stay

## 2024-05-30 ENCOUNTER — Inpatient Hospital Stay: Attending: Oncology

## 2024-05-30 VITALS — BP 105/67 | HR 59 | Temp 98.0°F | Resp 16

## 2024-05-30 DIAGNOSIS — R7989 Other specified abnormal findings of blood chemistry: Secondary | ICD-10-CM

## 2024-05-30 DIAGNOSIS — D696 Thrombocytopenia, unspecified: Secondary | ICD-10-CM

## 2024-05-30 LAB — FERRITIN: Ferritin: 308 ng/mL (ref 24–336)

## 2024-05-30 LAB — HEMOGLOBIN AND HEMATOCRIT (CANCER CENTER ONLY)
HCT: 37.7 % — ABNORMAL LOW (ref 39.0–52.0)
Hemoglobin: 13.2 g/dL (ref 13.0–17.0)

## 2024-06-03 ENCOUNTER — Other Ambulatory Visit: Payer: Self-pay

## 2024-06-03 DIAGNOSIS — R7989 Other specified abnormal findings of blood chemistry: Secondary | ICD-10-CM

## 2024-06-06 ENCOUNTER — Inpatient Hospital Stay

## 2024-06-13 ENCOUNTER — Inpatient Hospital Stay

## 2024-06-13 VITALS — BP 113/71 | HR 60 | Temp 97.7°F | Resp 16

## 2024-06-13 DIAGNOSIS — D696 Thrombocytopenia, unspecified: Secondary | ICD-10-CM

## 2024-06-13 DIAGNOSIS — R7989 Other specified abnormal findings of blood chemistry: Secondary | ICD-10-CM

## 2024-06-13 LAB — HEMOGLOBIN AND HEMATOCRIT (CANCER CENTER ONLY)
HCT: 37 % — ABNORMAL LOW (ref 39.0–52.0)
Hemoglobin: 13 g/dL (ref 13.0–17.0)

## 2024-06-13 NOTE — Progress Notes (Signed)
 Phlebotomy today HCT 37.0 today withdrawn, pt tolerated well.  Sprite given

## 2024-06-16 ENCOUNTER — Telehealth: Payer: Self-pay | Admitting: Oncology

## 2024-06-16 NOTE — Telephone Encounter (Signed)
 Pt called to see if he could just canc his lab/phleb appt for 1/26 - messaged clinical team and will call pt back - [facility]

## 2024-06-16 NOTE — Telephone Encounter (Signed)
 Called pt back to make aware that Melanee is okay w/canc lab/phleb - [facility]

## 2024-06-20 ENCOUNTER — Inpatient Hospital Stay

## 2024-06-27 ENCOUNTER — Inpatient Hospital Stay

## 2024-07-04 ENCOUNTER — Inpatient Hospital Stay: Attending: Oncology

## 2024-07-04 ENCOUNTER — Inpatient Hospital Stay

## 2024-07-11 ENCOUNTER — Inpatient Hospital Stay

## 2024-11-08 ENCOUNTER — Ambulatory Visit: Admitting: Oncology

## 2024-11-08 ENCOUNTER — Other Ambulatory Visit
